# Patient Record
Sex: Female | Born: 1995
Health system: Southern US, Community
[De-identification: ages and names within clinical notes are randomized; demographics above are authoritative.]

## PROBLEM LIST (undated history)

## (undated) ENCOUNTER — Inpatient Hospital Stay (HOSPITAL_COMMUNITY): Payer: Self-pay

## (undated) DIAGNOSIS — Z9189 Other specified personal risk factors, not elsewhere classified: Secondary | ICD-10-CM

## (undated) DIAGNOSIS — F32A Depression, unspecified: Secondary | ICD-10-CM

## (undated) DIAGNOSIS — F329 Major depressive disorder, single episode, unspecified: Secondary | ICD-10-CM

## (undated) DIAGNOSIS — F419 Anxiety disorder, unspecified: Secondary | ICD-10-CM

## (undated) HISTORY — PX: WISDOM TOOTH EXTRACTION: SHX21

## (undated) HISTORY — DX: Major depressive disorder, single episode, unspecified: F32.9

## (undated) HISTORY — DX: Anxiety disorder, unspecified: F41.9

## (undated) HISTORY — DX: Depression, unspecified: F32.A

---
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
## 2016-01-23 DIAGNOSIS — Z3201 Encounter for pregnancy test, result positive: Secondary | ICD-10-CM | POA: Diagnosis not present

## 2016-01-23 DIAGNOSIS — N912 Amenorrhea, unspecified: Secondary | ICD-10-CM | POA: Diagnosis not present

## 2016-04-02 MED FILL — MONO-LINYAH 28 TABLET: 0.25-35 | 84 days supply | Qty: 84 | Fill #3

## 2016-06-11 DIAGNOSIS — H52223 Regular astigmatism, bilateral: Secondary | ICD-10-CM | POA: Diagnosis not present

## 2016-06-11 DIAGNOSIS — H5213 Myopia, bilateral: Secondary | ICD-10-CM | POA: Diagnosis not present

## 2016-06-13 ENCOUNTER — Encounter: Payer: Self-pay | Admitting: Nurse Practitioner

## 2016-06-13 ENCOUNTER — Ambulatory Visit (INDEPENDENT_AMBULATORY_CARE_PROVIDER_SITE_OTHER): Payer: 59 | Admitting: Nurse Practitioner

## 2016-06-13 VITALS — BP 122/82 | Ht 65.5 in | Wt 181.6 lb

## 2016-06-13 DIAGNOSIS — Z0000 Encounter for general adult medical examination without abnormal findings: Secondary | ICD-10-CM | POA: Diagnosis not present

## 2016-06-13 DIAGNOSIS — Z9889 Other specified postprocedural states: Secondary | ICD-10-CM | POA: Insufficient documentation

## 2016-06-13 MED ORDER — NORGESTIMATE-ETH ESTRADIOL 0.25-35 MG-MCG PO TABS: 1.0000 | ORAL_TABLET | Freq: Every day | ORAL | 3 refills | Status: DC

## 2016-06-13 MED FILL — MONO-LINYAH 28 TABLET: 0.25-35 | 84 days supply | Qty: 84 | Fill #0

## 2016-06-13 NOTE — Progress Notes (Signed)
   Subjective:    Patient ID: Kendra Aguilar, female    DOB: [birth year], 21 y.o.   MRN: [id number]  HPI 21 yo female seen today for routine wellness exam.  Wears glasses, gets routine eye exams, last exam 06/2016.  Gets routine dental exams.  LMP 06/12/16, no new sexual partners.  Has previously been screened for gon/chlamydia per pt report. Has rec'd HPV vaccines. Endorses a good appetite, healthy food choices, and exercises frequently.  Would like to discuss birth control options.  Had elective abortion in October 2017 and confirmed she became pregnant due to missed pills.  Was told she needed a Rhogam injection, but did not receive one and questioning if she needs to get it now.     Review of Systems  Constitutional: Negative for activity change, appetite change, chills, fatigue, fever and unexpected weight change.  HENT: Positive for congestion and postnasal drip. Negative for dental problem, ear pain, sinus pain, sinus pressure, sore throat and trouble swallowing.   Respiratory: Negative for cough, chest tightness, shortness of breath and wheezing.   Cardiovascular: Negative for chest pain.  Gastrointestinal: Negative for abdominal distention, abdominal pain, constipation, diarrhea, nausea and vomiting.  Genitourinary: Negative for difficulty urinating, dysuria, enuresis, frequency, genital sores, menstrual problem, pelvic pain, urgency, vaginal discharge and vaginal pain.  Neurological: Negative for weakness and light-headedness.  Psychiatric/Behavioral: Negative for dysphoric mood and sleep disturbance. The patient is not nervous/anxious.        Objective:   Physical Exam  Constitutional: She is oriented to person, place, and time. She appears well-developed and well-nourished. No distress.  HENT:  Right Ear: External ear normal.  Left Ear: External ear normal.  Mouth/Throat: Oropharyngeal exudate present.  Scant, cloudy exudate, no lesions/sores, no injection, tonsils 2+  Neck:  Normal range of motion. Neck supple. No tracheal deviation present. No thyromegaly present.  Cardiovascular: Normal rate, regular rhythm and normal heart sounds.  Exam reveals no gallop.   No murmur heard. Pulmonary/Chest: Effort normal and breath sounds normal.  Abdominal: Soft. She exhibits no distension. There is no tenderness.  Genitourinary:  Genitourinary Comments: Currently on menstrual cycle, no routine pelvic or PAP required at this time   Musculoskeletal: Normal range of motion. She exhibits no edema.  Lymphadenopathy:    She has no cervical adenopathy.  Neurological: She is alert and oriented to person, place, and time.  Skin: Skin is warm and dry. No rash noted.  Psychiatric: She has a normal mood and affect. Her behavior is normal.  Breasts: fine nodularities with some dense breast tissue, no obvious masses palpable, no nipple discharge, symmetrical, no skin discolorations, no axillary adenopathy.      Assessment & Plan:  Routine general medical examination at a health care facility  Discussed several birth control options, prefers to continue on birth control pills at this time.  Will notify office if she decides to try another method.  Educated about not missing pills and using back-up methods if taking antibiotics, missed pills, and help prevent STDs.  Rhogam injection not necessary at this time, due to length of time since abortion.     Return in about 1 year (around 06/13/2017) for physical.

## 2016-09-04 ENCOUNTER — Encounter: Payer: Self-pay | Admitting: Nurse Practitioner

## 2016-09-04 ENCOUNTER — Telehealth: Payer: 59 | Admitting: Physician Assistant

## 2016-09-04 DIAGNOSIS — R198 Other specified symptoms and signs involving the digestive system and abdomen: Secondary | ICD-10-CM

## 2016-09-04 DIAGNOSIS — N941 Unspecified dyspareunia: Secondary | ICD-10-CM

## 2016-09-04 NOTE — Progress Notes (Signed)
Based on what you shared with me it looks like you have a condition that should be evaluated in a face to face office visit.  Giving the pain with bowel movement and on intercourse, you need a pelvic examination in addition to testing to determine definite cause of symptoms. NOTE: Even if you have entered your credit card information for this eVisit, you will not be charged.   If you are having a true medical emergency please call 911.  If you need an urgent face to face visit, [place] has four urgent care centers for your convenience.  If you need care fast and have a high deductible or no insurance consider:   [email or web address]  [phone]  [address], [address] [place], [place] [address] 8 am to 8 pm Monday-Friday 10 am to 4 pm Saturday-Sunday   The following sites will take your  insurance:    . [facility]  [phone] Get Driving Directions Find a Provider at this Location  [address] [place], [place] [address] . 10 am to 8 pm Monday-Friday . 12 pm to 8 pm Saturday-Sunday   . [facility]  [phone] Get Driving Directions Find a Provider at this Location  [address] [place], [place] [address] . 8 am to 8 pm Monday-Friday . 9 am to 6 pm Saturday . 11 am to 6 pm Sunday   . [facility] [place]  [phone] Get Driving Directions  [address] [place], [place] [address] . 8 am to 8 pm Monday-Friday . 8 am to 4 pm Saturday-Sunday   Your e-visit answers were reviewed by a board certified advanced clinical practitioner to complete your personal care plan.  Thank you for using e-Visits.

## 2016-09-09 MED FILL — MONO-LINYAH 28 TABLET: 0.25-35 | 84 days supply | Qty: 84 | Fill #1

## 2016-09-28 ENCOUNTER — Encounter: Payer: Self-pay | Admitting: Nurse Practitioner

## 2016-10-10 ENCOUNTER — Other Ambulatory Visit: Payer: Self-pay | Admitting: Nurse Practitioner

## 2016-10-10 ENCOUNTER — Encounter: Payer: Self-pay | Admitting: Nurse Practitioner

## 2016-10-10 DIAGNOSIS — Z30017 Encounter for initial prescription of implantable subdermal contraceptive: Secondary | ICD-10-CM

## 2016-10-10 NOTE — Progress Notes (Signed)
refe

## 2016-10-21 ENCOUNTER — Encounter: Payer: Self-pay | Admitting: Family Medicine

## 2016-11-10 ENCOUNTER — Ambulatory Visit: Payer: 59 | Admitting: Women's Health

## 2017-04-30 MED FILL — FEMYNOR 0.25-35 MG-MCG TABS: 0.25-35 | 84 days supply | Qty: 84 | Fill #2

## 2017-05-04 ENCOUNTER — Encounter: Payer: Self-pay | Admitting: Nurse Practitioner

## 2017-05-09 ENCOUNTER — Encounter: Payer: Self-pay | Admitting: Nurse Practitioner

## 2017-06-12 DIAGNOSIS — H52223 Regular astigmatism, bilateral: Secondary | ICD-10-CM | POA: Diagnosis not present

## 2017-06-12 DIAGNOSIS — H5213 Myopia, bilateral: Secondary | ICD-10-CM | POA: Diagnosis not present

## 2017-06-22 ENCOUNTER — Telehealth: Payer: Self-pay | Admitting: Nurse Practitioner

## 2017-06-22 ENCOUNTER — Ambulatory Visit (INDEPENDENT_AMBULATORY_CARE_PROVIDER_SITE_OTHER): Payer: 59 | Admitting: Nurse Practitioner

## 2017-06-22 ENCOUNTER — Encounter: Payer: Self-pay | Admitting: Nurse Practitioner

## 2017-06-22 VITALS — BP 118/72 | Ht 65.5 in | Wt 184.8 lb

## 2017-06-22 DIAGNOSIS — Z113 Encounter for screening for infections with a predominantly sexual mode of transmission: Secondary | ICD-10-CM | POA: Diagnosis not present

## 2017-06-22 DIAGNOSIS — Z30013 Encounter for initial prescription of injectable contraceptive: Secondary | ICD-10-CM | POA: Diagnosis not present

## 2017-06-22 DIAGNOSIS — R45851 Suicidal ideations: Secondary | ICD-10-CM

## 2017-06-22 DIAGNOSIS — F322 Major depressive disorder, single episode, severe without psychotic features: Secondary | ICD-10-CM

## 2017-06-22 DIAGNOSIS — Z01419 Encounter for gynecological examination (general) (routine) without abnormal findings: Secondary | ICD-10-CM | POA: Diagnosis not present

## 2017-06-22 DIAGNOSIS — Z0001 Encounter for general adult medical examination with abnormal findings: Secondary | ICD-10-CM

## 2017-06-22 LAB — POCT URINE PREGNANCY: Preg Test, Ur: NEGATIVE

## 2017-06-22 MED ORDER — MEDROXYPROGESTERONE ACETATE 150 MG/ML IM SUSP
150.0000 mg | Freq: Once | INTRAMUSCULAR | Status: AC
  Administered 2017-06-22: 150 mg via INTRAMUSCULAR

## 2017-06-22 NOTE — Patient Instructions (Signed)
Nexplanon Kyleena or Mirena

## 2017-06-22 NOTE — Telephone Encounter (Signed)
Patient signed records release for you to send information to [employer] [facility] as requested.  See in yellow folder on desk.

## 2017-06-23 ENCOUNTER — Encounter: Payer: Self-pay | Admitting: Nurse Practitioner

## 2017-06-23 DIAGNOSIS — R45851 Suicidal ideations: Secondary | ICD-10-CM | POA: Insufficient documentation

## 2017-06-23 LAB — CHLAMYDIA/GONOCOCCUS/TRICHOMONAS, NAA
CHLAMYDIA BY NAA: NEGATIVE
Gonococcus by NAA: NEGATIVE
Trich vag by NAA: NEGATIVE

## 2017-06-23 LAB — HIV ANTIBODY (ROUTINE TESTING W REFLEX): HIV SCREEN 4TH GENERATION: NONREACTIVE

## 2017-06-23 LAB — RPR: RPR Ser Ql: NONREACTIVE

## 2017-06-23 LAB — HEPATITIS C ANTIBODY: Hep C Virus Ab: <0.1 s/co ratio (ref 0.0–0.9)

## 2017-06-23 NOTE — Telephone Encounter (Signed)
Noted  

## 2017-06-23 NOTE — Progress Notes (Signed)
Subjective:    Patient ID: Kendra Aguilar, female    DOB: 1995/12/19, 21 y.o.   MRN: [id number]  HPI presents for her wellness exam.  Has had a new female sexual partner over the past month.  Was diagnosed with trichomonas in January and treated.  The symptoms have resolved.  No pelvic pain.  Regular cycles with normal flow.  Has been off her birth control pills.  States she uses condoms consistently every time she has intercourse.  Last intercourse was 3 days ago.  No pelvic pain or vaginal discharge.  Regular vision and dental exams.  Regular activity.  Is a Consulting civil engineer at [employer].  Uses alcohol 1-2 times per week.  Denies any tobacco drug or marijuana use. Depression screen [facility] 2/9 06/22/2017 06/22/2017 06/13/2016  Decreased Interest 2 2 0  Down, Depressed, Hopeless 2 2 0  PHQ - 2 Score 4 4 0  Altered sleeping 3 3 -  Tired, decreased energy 2 2 -  Change in appetite 1 1 -  Feeling bad or failure about yourself  3 3 -  Trouble concentrating 2 2 -  Moving slowly or fidgety/restless 3 3 -  Suicidal thoughts 2 2 -  PHQ-9 Score 20 20 -   A lengthy discussion regarding her mental health status. She has had trouble since December when she lost her scholarship. Going to counseling once a month to get this back. Has been helping some. Has had brief thoughts of hurting herself but no obsessive thoughts or specific plan. At times, just feels everyone would be better off without having to deal with her. No homicidal thoughts. Denies any current self injurious behaviors.    Review of Systems  Constitutional: Positive for fatigue. Negative for activity change, appetite change and fever.  HENT: Negative for dental problem, ear pain, sinus pressure and sore throat.   Respiratory: Negative for cough, chest tightness, shortness of breath and wheezing.   Cardiovascular: Negative for chest pain.  Gastrointestinal: Negative for abdominal distention, abdominal pain, constipation, diarrhea, nausea and vomiting.    Genitourinary: Negative for difficulty urinating, dysuria, enuresis, frequency, genital sores, menstrual problem, pelvic pain, urgency and vaginal discharge.  Psychiatric/Behavioral: Positive for decreased concentration, dysphoric mood, sleep disturbance and suicidal ideas.       Objective:   Physical Exam  Constitutional: She is oriented to person, place, and time. She appears well-developed. No distress.  HENT:  Right Ear: External ear normal.  Left Ear: External ear normal.  Mouth/Throat: Oropharynx is clear and moist.  Neck: Normal range of motion. Neck supple. No tracheal deviation present. No thyromegaly present.  Cardiovascular: Normal rate, regular rhythm and normal heart sounds. Exam reveals no gallop.  No murmur heard. Pulmonary/Chest: Effort normal and breath sounds normal. Right breast exhibits no inverted nipple, no mass, no skin change and no tenderness. Left breast exhibits no inverted nipple, no mass, no skin change and no tenderness. Breasts are symmetrical.  Dense tissue; axillae no adenopathy.   Abdominal: Soft. She exhibits no distension. There is no tenderness.  Genitourinary:  Genitourinary Comments: Defers GU exam; denies any problems.   Musculoskeletal: She exhibits no edema.  Lymphadenopathy:    She has no cervical adenopathy.  Neurological: She is alert and oriented to person, place, and time.  Skin: Skin is warm and dry. No rash noted.  Psychiatric: She has a normal mood and affect. Her behavior is normal.  Vitals reviewed. urine HCG neg.      Assessment & Plan:  Problem List Items Addressed This Visit      Other   Suicidal thoughts    Other Visit Diagnoses    Well woman exam    -  Primary   Screening examination for STD (sexually transmitted disease)       Relevant Orders   Chlamydia/Gonococcus/Trichomonas, NAA   HIV antibody (Completed)   RPR (Completed)   Hepatitis C Antibody (Completed)   Encounter for initial prescription of injectable  contraceptive       Relevant Medications   medroxyPROGESTERone (DEPO-PROVERA) injection 150 mg (Completed)   Other Relevant Orders   POCT urine pregnancy (Completed)     Meds ordered this encounter  Medications  . medroxyPROGESTERone (DEPO-PROVERA) injection 150 mg   Discussed contraceptive options. Call back if any BTB on Depo Provera. Discussed safe sex issues.  A lengthy discussion regarding her depression. Based on our discussion, do not feel patient is a danger to herself or others at this time. While the situation is not emergent, there is an urgent need for mental health intervention. With patient going back to [employer] today, strongly recommend she seek help at student health to at least direct her to services. She agrees to seek help at crisis center or local ED if she gets worse. She signed a ROR to [employer] so we can get her notes from todays visit to them as quickly as possible.  STD testing pending. Patient will look into Nexplanon or Kyleena as other contraceptive options.

## 2017-06-24 ENCOUNTER — Telehealth: Payer: Self-pay | Admitting: *Deleted

## 2017-06-24 NOTE — Progress Notes (Signed)
Toniann FailWendy, please try again this am. I also sent her a my chart message with her recent labs. Thanks.

## 2017-06-24 NOTE — Telephone Encounter (Signed)
Kendra Aguilar from [place] [facility] called back and stated they cannot send a message back to the nurse without the student ID. I gave them the pt's phone number so they could call and get her student ID.

## 2017-06-24 NOTE — Telephone Encounter (Signed)
Called [employer] [place] [facility] per Eber Jonesarolyn and faxed over [employer]'s last office visit note. Left a message with the nurse to return call to verify that they received the fax and also to discuss getting pt set up with mental health for depression.

## 2017-06-24 NOTE — Telephone Encounter (Signed)
Called the number given and talked with someone at student health. Gave pt info. They will contact pt to get her an appt scheduled for depression. Faxed over notes again to the fax number given.

## 2017-06-25 ENCOUNTER — Encounter: Payer: Self-pay | Admitting: Nurse Practitioner

## 2017-09-01 ENCOUNTER — Encounter: Payer: Self-pay | Admitting: Nurse Practitioner

## 2017-09-02 ENCOUNTER — Other Ambulatory Visit: Payer: Self-pay | Admitting: Nurse Practitioner

## 2017-09-02 ENCOUNTER — Encounter: Payer: Self-pay | Admitting: Nurse Practitioner

## 2017-09-02 MED ORDER — NORGESTIMATE-ETH ESTRADIOL 0.25-35 MG-MCG PO TABS: 1.0000 | ORAL_TABLET | Freq: Every day | ORAL | 3 refills | Status: DC

## 2017-09-02 MED FILL — FEMYNOR 0.25-35 MG-MCG TABS: 0.25-35 | 84 days supply | Qty: 84 | Fill #0

## 2017-09-03 ENCOUNTER — Encounter: Payer: Self-pay | Admitting: Nurse Practitioner

## 2017-09-21 ENCOUNTER — Encounter: Payer: Self-pay | Admitting: Nurse Practitioner

## 2017-09-22 ENCOUNTER — Encounter: Payer: Self-pay | Admitting: Nurse Practitioner

## 2017-09-23 ENCOUNTER — Encounter: Payer: Self-pay | Admitting: Nurse Practitioner

## 2017-09-24 ENCOUNTER — Encounter: Payer: Self-pay | Admitting: Nurse Practitioner

## 2017-09-28 ENCOUNTER — Other Ambulatory Visit: Payer: Self-pay | Admitting: Nurse Practitioner

## 2017-09-28 MED ORDER — PHENTERMINE HCL 37.5 MG PO TABS: 37.5000 mg | ORAL_TABLET | Freq: Every day | ORAL | 0 refills | Status: DC

## 2017-09-29 MED FILL — PHENTERMINE 37.5 MG TABLET: 37.5 | 30 days supply | Qty: 30 | Fill #0

## 2017-09-30 ENCOUNTER — Encounter: Payer: Self-pay | Admitting: Nurse Practitioner

## 2017-12-08 ENCOUNTER — Encounter: Payer: Self-pay | Admitting: Family Medicine

## 2017-12-10 ENCOUNTER — Telehealth: Payer: Self-pay | Admitting: Family Medicine

## 2017-12-10 NOTE — Telephone Encounter (Signed)
Pt requesting copy of shot records.

## 2017-12-10 NOTE — Telephone Encounter (Signed)
Shot record printed and patient is aware.

## 2017-12-18 ENCOUNTER — Other Ambulatory Visit: Payer: Self-pay | Admitting: Family Medicine

## 2017-12-18 ENCOUNTER — Ambulatory Visit (INDEPENDENT_AMBULATORY_CARE_PROVIDER_SITE_OTHER): Payer: 59 | Admitting: Family Medicine

## 2017-12-18 ENCOUNTER — Encounter: Payer: Self-pay | Admitting: Family Medicine

## 2017-12-18 VITALS — BP 118/80 | Ht 65.5 in | Wt 197.0 lb

## 2017-12-18 DIAGNOSIS — Z7689 Persons encountering health services in other specified circumstances: Secondary | ICD-10-CM

## 2017-12-18 DIAGNOSIS — Z3009 Encounter for other general counseling and advice on contraception: Secondary | ICD-10-CM | POA: Diagnosis not present

## 2017-12-18 DIAGNOSIS — Z3202 Encounter for pregnancy test, result negative: Secondary | ICD-10-CM

## 2017-12-18 LAB — POCT URINE PREGNANCY: PREG TEST UR: NEGATIVE

## 2017-12-18 MED ORDER — PHENTERMINE HCL 37.5 MG PO TABS: 37.5000 mg | ORAL_TABLET | Freq: Every day | ORAL | 1 refills | Status: DC

## 2017-12-18 NOTE — Patient Instructions (Signed)
Ketogenic diet information: google diet doctor keto

## 2017-12-18 NOTE — Progress Notes (Signed)
   Subjective:    Patient ID: Kendra Aguilar, female    DOB: 1996/03/14, 22 y.o.   MRN: [id number]  HPI Pt here today to discuss other forms of birth control. Pt states she stopped taking the Norgestimate about one month ago. Pt would like to discuss the birth control patch. Pt would like refill on Phentermine. She would like birth control that is not a daily hassle.   LMP: 12/07/17, been regular since off Depo, menses x 7 days, first 2 days are heaviest. Reports cramping.  Gets migraine on 3rd of menses, with almost every cycle. Reports pain to left temple area, throbbing pain, some nausea but no vomiting, photophobia.  Takes 1 aleve and that helps, usually h/a completely gone in a couple hours. Reports having an aura of seeing black little spots prior to h/a beginning.   Sexually active: 1 partner, long-term relationship. Pt not concerned for STDs. Using condoms for birth control currently, every time.   Doing ketogenic diet and cardio workouts twice per week for weight loss. Would like a refill on phentermine. Denies chest pain or palpitations while taking. States she thinks it was helpful for her. Has lost 19 lbs.  Review of Systems  Constitutional: Negative for unexpected weight change (has been working on weight loss).  Respiratory: Negative for shortness of breath.   Cardiovascular: Negative for chest pain, palpitations and leg swelling.  All other systems reviewed and are negative.      Objective:   Physical Exam  Constitutional: She is oriented to person, place, and time. She appears well-developed and well-nourished. No distress.  HENT:  Head: Normocephalic and atraumatic.  Eyes: Right eye exhibits no discharge. Left eye exhibits no discharge.  Neck: Neck supple.  Cardiovascular: Normal rate, regular rhythm and normal heart sounds.  No murmur heard. Pulmonary/Chest: Effort normal and breath sounds normal. No respiratory distress.  Neurological: She is alert and oriented to  person, place, and time.  Skin: Skin is warm and dry.  Psychiatric: She has a normal mood and affect.  Vitals reviewed.       Assessment & Plan:  1. Encounter for counseling regarding contraception  Pt is looking for contraception to prevent pregnancy.  She is looking for something that she doesn't have to think about on a daily basis.  Given her history of migraine with aura I think the best method for contraception will be an IUD. Discussed this at length with patient and she is in agreement.  Referral to GYN for placement made. Discussed the importance of continuing consistent use of condoms. Pt verbalized understanding.  - Ambulatory referral to Gynecology - POCT urine pregnancy  2. Weight loss management: Phentermine refilled for two months, understands the risks and benefits and that this is only meant for short-term use to help kick start her weight loss. Encouraged continued efforts at diet and exercise in order to maintain weight loss for the long term.  I did discuss with the patient that IUD would be her best approach toward getting good control and avoiding having to take a pill and it would lessen risk of blood clots that could come along with a estrogen patch As attending physician to this patient visit, this patient was seen in conjunction with the nurse practitioner.  The history,physical and treatment plan was reviewed with the nurse practitioner and pertinent findings were verified with the patient.  Also the treatment plan was reviewed with the patient while they were present.

## 2017-12-21 MED ORDER — SERTRALINE HCL 100 MG PO TABS: 100.0000 mg | ORAL_TABLET | Freq: Every day | ORAL | 3 refills | Status: DC

## 2017-12-21 NOTE — Addendum Note (Signed)
Addended by: Margaretha SheffieldBROWN, Breland Trouten S on: 12/21/2017 11:22 AM   Modules accepted: Orders

## 2017-12-21 NOTE — Telephone Encounter (Signed)
Nurses-the patient was seen by Lillia AbedLindsay  the other day The patient's my chart message indicates that she wanted refills of her Zoloft but I do not see where this was given Please connect with the patient if she does want refills she may have this +3 additional refills with follow-up in 4 months on this issue sooner if any problems

## 2017-12-22 MED FILL — PHENTERMINE 37.5 MG TABLET: 37.5 | 30 days supply | Qty: 30 | Fill #0

## 2017-12-31 ENCOUNTER — Telehealth: Payer: Self-pay | Admitting: Family Medicine

## 2017-12-31 MED ORDER — SERTRALINE HCL 100 MG PO TABS: 100.0000 mg | ORAL_TABLET | Freq: Every day | ORAL | 3 refills | Status: DC

## 2017-12-31 NOTE — Telephone Encounter (Signed)
Patient stated her pharmacy is no longer [facility] her listed pharmacy should be [facility] - [place], [place] - [address] [facility]. Medication listed sertraline (ZOLOFT) 100 MG tablet) was sent to [id number] - [place], [place] - [address]

## 2017-12-31 NOTE — Telephone Encounter (Signed)
Prescription sent electronically to pharmacy. Patent notified.

## 2018-01-06 ENCOUNTER — Ambulatory Visit (INDEPENDENT_AMBULATORY_CARE_PROVIDER_SITE_OTHER): Payer: 59 | Admitting: Advanced Practice Midwife

## 2018-01-06 ENCOUNTER — Encounter: Payer: Self-pay | Admitting: Advanced Practice Midwife

## 2018-01-06 VITALS — BP 125/78 | HR 87 | Ht 65.0 in | Wt 195.5 lb

## 2018-01-06 DIAGNOSIS — Z308 Encounter for other contraceptive management: Secondary | ICD-10-CM | POA: Diagnosis not present

## 2018-01-06 DIAGNOSIS — G43909 Migraine, unspecified, not intractable, without status migrainosus: Secondary | ICD-10-CM | POA: Insufficient documentation

## 2018-01-06 MED ORDER — MISOPROSTOL 200 MCG PO TABS: 600.0000 ug | ORAL_TABLET | Freq: Once | ORAL | 0 refills | Status: DC

## 2018-01-06 MED FILL — miSOPROStol 200 MCG TABS: 200 | 1 days supply | Qty: 3 | Fill #0

## 2018-01-06 NOTE — Progress Notes (Signed)
[facility] Visit  Patient name: Kendra Aguilar MRN [id number]  Date of birth: Jun 03, 1995  CC & HPI:  Kendra Aguilar is a 22 y.o.  female presenting today for [facility] discussion .Had been on COCs, but can't remember to take them.  Also, has migraine w/aura, so should avoid estrogen products.  Her PCP recommended paragard, and pt agrees after discussion of Liletta vs Paragard. She consistently uses condoms. Risks/benefits/SE of IUDs discussed.   Pertinent History Reviewed:  Medical & Surgical Hx:   Past Medical History:  Diagnosis Date  . Anxiety   . Depression    History reviewed. No pertinent surgical history. Family History  Problem Relation Age of Onset  . Hyperlipidemia Mother   . Diabetes Brother        Type I  . Other Brother        knee and eye problems    Current Outpatient Medications:  .  phentermine (ADIPEX-P) 37.5 MG tablet, Take 1 tablet (37.5 mg total) by mouth daily before breakfast., Disp: 30 tablet, Rfl: 1 .  sertraline (ZOLOFT) 100 MG tablet, Take 1 tablet (100 mg total) by mouth daily., Disp: 30 tablet, Rfl: 3 .  misoprostol (CYTOTEC) 200 MCG tablet, Take 3 tablets (600 mcg total) by mouth once for 1 dose. 5-6 hours before your IUD appointment, Disp: 3 tablet, Rfl: 0  Current Facility-Administered Medications:  .  hpv vaccine (GARDASIL) injection 0.5 mL, 0.5 mL, Intramuscular, Once, Luking, Jonna Coup, MD Social History: Reviewed -  reports that she has never smoked. She has never used smokeless tobacco.  Review of Systems:   Constitutional: Negative for fever and chills Eyes: Negative for visual disturbances Respiratory: Negative for shortness of breath, dyspnea Cardiovascular: Negative for chest pain or palpitations  Gastrointestinal: Negative for vomiting, diarrhea and constipation; no abdominal pain Genitourinary: Negative for dysuria and urgency, vaginal irritation or itching Musculoskeletal: Negative for back pain, joint pain, myalgias   Neurological: Negative for dizziness and headaches    Objective Findings:    Physical Examination: Vitals:   01/06/18 1511  BP: 125/78  Pulse: 87   General appearance - well appearing, and in no distress Mental status - alert, oriented to person, place, and time Chest:  Normal respiratory effort Heart - normal rate and regular rhythm Pelvic: deferred Musculoskeletal:  Normal range of motion without pain Extremities:  No edema    No results found for this or any previous visit (from the past 24 hour(s)).    Assessment & Plan:  A:   Contraception mgt P:  Wants Paragard; premedicate w/cytotec Plan for placement on her period.  Return for call when her work schedule comes out for paragard; check to see if we need to order vs office stock.  Jacklyn Shell CNM 01/06/2018 4:13 PM

## 2018-01-06 NOTE — Patient Instructions (Signed)

## 2018-01-14 ENCOUNTER — Telehealth: Payer: Self-pay | Admitting: Advanced Practice Midwife

## 2018-01-14 ENCOUNTER — Other Ambulatory Visit: Payer: Self-pay | Admitting: Advanced Practice Midwife

## 2018-01-14 MED ORDER — ULIPRISTAL ACETATE 30 MG PO TABS: 1.0000 | ORAL_TABLET | Freq: Once | ORAL | 0 refills | Status: AC

## 2018-01-14 NOTE — Progress Notes (Signed)
Kendra Aguilar rx for [facility] (Tuesday condom broke)

## 2018-01-25 MED FILL — SERTRALINE HCL 100 MG TAB: 100 | 30 days supply | Qty: 30 | Fill #0

## 2018-02-22 ENCOUNTER — Encounter: Payer: Self-pay | Admitting: Women's Health

## 2018-02-22 ENCOUNTER — Other Ambulatory Visit: Payer: Self-pay

## 2018-02-22 ENCOUNTER — Ambulatory Visit (INDEPENDENT_AMBULATORY_CARE_PROVIDER_SITE_OTHER): Payer: 59 | Admitting: Women's Health

## 2018-02-22 VITALS — BP 138/74 | HR 87 | Ht 66.0 in | Wt 194.0 lb

## 2018-02-22 DIAGNOSIS — F418 Other specified anxiety disorders: Secondary | ICD-10-CM | POA: Diagnosis not present

## 2018-02-22 DIAGNOSIS — Z3201 Encounter for pregnancy test, result positive: Secondary | ICD-10-CM | POA: Diagnosis not present

## 2018-02-22 DIAGNOSIS — R112 Nausea with vomiting, unspecified: Secondary | ICD-10-CM

## 2018-02-22 DIAGNOSIS — Z3490 Encounter for supervision of normal pregnancy, unspecified, unspecified trimester: Secondary | ICD-10-CM

## 2018-02-22 DIAGNOSIS — Z3491 Encounter for supervision of normal pregnancy, unspecified, first trimester: Secondary | ICD-10-CM

## 2018-02-22 LAB — POCT URINE PREGNANCY: Preg Test, Ur: POSITIVE — AB

## 2018-02-22 NOTE — Progress Notes (Signed)
   GYN VISIT Patient name: Kendra Aguilar Clarey MRN [id number]  Date of birth: [birth year] Chief Complaint:   Possible Pregnancy  History of Present Illness:   Kendra Aguilar Kohut is a 22 y.o. [id number] African American female at 1460w3d by LMP of 9/20, being seen today for +HPT. Is certain end of period was 9/25, thinks 1st day was 9/20. Some n/v, declines meds. Taking pnv. On zoloft for dep/anx.      Patient's last menstrual period was 12/25/2017. Review of Systems:   Pertinent items are noted in HPI Denies fever/chills, dizziness, headaches, visual disturbances, fatigue, shortness of breath, chest pain, abdominal pain, vomiting, abnormal vaginal discharge/itching/odor/irritation, problems with periods, bowel movements, urination, or intercourse unless otherwise stated above.  Pertinent History Reviewed:  Reviewed past medical,surgical, social, obstetrical and family history.  Reviewed problem list, medications and allergies. Physical Assessment:   Vitals:   02/22/18 1207  BP: 138/74  Pulse: 87  Weight: 194 lb (88 kg)  Height: 5\' 6"  (1.676 m)  Body mass index is 31.31 kg/m.       Physical Examination:   General appearance: alert, well appearing, and in no distress  Mental status: alert, oriented to person, place, and time  Skin: warm & dry   Cardiovascular: normal heart rate noted  Respiratory: normal respiratory effort, no distress  Abdomen: soft, non-tender   Pelvic: examination not indicated  Extremities: no edema   Results for orders placed or performed in visit on 02/22/18 (from the past 24 hour(s))  POCT urine pregnancy   Collection Time: 02/22/18 12:09 PM  Result Value Ref Range   Preg Test, Ur Positive (A) Negative    Assessment & Plan:  1) 5760w3d by LMP> continue pnv, will get dating u/s  2) Dep/anx> continue zoloft  3) N/V> gave printed prevention/relief measures, let us know if needs meds  Meds: No orders of the defined types were placed in this  encounter.   Orders Placed This Encounter  Procedures  . US OB Comp Less 14 Wks  . POCT urine pregnancy    Return for 1st available dating u/s.  Cheral MarkerKimberly R Laveta Gilkey CNM, [facility]NP-BC 02/22/2018 12:48 PM

## 2018-02-22 NOTE — Patient Instructions (Addendum)
Kendra Aguilar, I greatly value your feedback.  If you receive a survey following your visit with us today, we appreciate you taking the time to fill it out.  Thanks, Kendra Aguilar, CNM, WHNP-BC   Nausea & Vomiting  Have saltine crackers or pretzels by your bed and eat a few bites before you raise your head out of bed in the morning  Eat small frequent meals throughout the day instead of large meals  Drink plenty of fluids throughout the day to stay hydrated, just don't drink a lot of fluids with your meals.  This can make your stomach fill up faster making you feel sick  Do not brush your teeth right after you eat  Products with real ginger are good for nausea, like ginger ale and ginger hard candy Make sure it says made with real ginger!  Sucking on sour candy like lemon heads is also good for nausea  If your prenatal vitamins make you nauseated, take them at night so you will sleep through the nausea  Sea Bands  If you feel like you need medicine for the nausea & vomiting please let us know  If you are unable to keep any fluids or food down please let us know   Constipation  Drink plenty of fluid, preferably water, throughout the day  Eat foods high in fiber such as fruits, vegetables, and grains  Exercise, such as walking, is a good way to keep your bowels regular  Drink warm fluids, especially warm prune juice, or decaf coffee  Eat a 1/2 cup of real oatmeal (not instant), 1/2 cup applesauce, and 1/2-1 cup warm prune juice every day  If needed, you may take Colace (docusate sodium) stool softener once or twice a day to help keep the stool soft. If you are pregnant, wait until you are out of your first trimester (12-14 weeks of pregnancy)  If you still are having problems with constipation, you may take Miralax once daily as needed to help keep your bowels regular.  If you are pregnant, wait until you are out of your first trimester (12-14 weeks of pregnancy)   First  Trimester of Pregnancy The first trimester of pregnancy is from week 1 until the end of week 12 (months 1 through 3). A week after a sperm fertilizes an egg, the egg will implant on the wall of the uterus. This embryo will begin to develop into a baby. Genes from you and your partner are forming the baby. The female genes determine whether the baby is a boy or a girl. At 6-8 weeks, the eyes and face are formed, and the heartbeat can be seen on ultrasound. At the end of 12 weeks, all the baby's organs are formed.  Now that you are pregnant, you will want to do everything you can to have a healthy baby. Two of the most important things are to get good prenatal care and to follow your health care provider's instructions. Prenatal care is all the medical care you receive before the baby's birth. This care will help prevent, find, and treat any problems during the pregnancy and childbirth. BODY CHANGES Your body goes through many changes during pregnancy. The changes vary from woman to woman.   You may gain or lose a couple of pounds at first.  You may feel sick to your stomach (nauseous) and throw up (vomit). If the vomiting is uncontrollable, call your health care provider.  You may tire easily.  You may develop headaches  that can be relieved by medicines approved by your health care provider.  You may urinate more often. Painful urination may mean you have a bladder infection.  You may develop heartburn as a result of your pregnancy.  You may develop constipation because certain hormones are causing the muscles that push waste through your intestines to slow down.  You may develop hemorrhoids or swollen, bulging veins (varicose veins).  Your breasts may begin to grow larger and become tender. Your nipples may stick out more, and the tissue that surrounds them (areola) may become darker.  Your gums may bleed and may be sensitive to brushing and flossing.  Dark spots or blotches (chloasma, mask  of pregnancy) may develop on your face. This will likely fade after the baby is born.  Your menstrual periods will stop.  You may have a loss of appetite.  You may develop cravings for certain kinds of food.  You may have changes in your emotions from day to day, such as being excited to be pregnant or being concerned that something may go wrong with the pregnancy and baby.  You may have more vivid and strange dreams.  You may have changes in your hair. These can include thickening of your hair, rapid growth, and changes in texture. Some women also have hair loss during or after pregnancy, or hair that feels dry or thin. Your hair will most likely return to normal after your baby is born. WHAT TO EXPECT AT YOUR PRENATAL VISITS During a routine prenatal visit:  You will be weighed to make sure you and the baby are growing normally.  Your blood pressure will be taken.  Your abdomen will be measured to track your baby's growth.  The fetal heartbeat will be listened to starting around week 10 or 12 of your pregnancy.  Test results from any previous visits will be discussed. Your health care provider may ask you:  How you are feeling.  If you are feeling the baby move.  If you have had any abnormal symptoms, such as leaking fluid, bleeding, severe headaches, or abdominal cramping.  If you have any questions. Other tests that may be performed during your first trimester include:  Blood tests to find your blood type and to check for the presence of any previous infections. They will also be used to check for low iron levels (anemia) and Rh antibodies. Later in the pregnancy, blood tests for diabetes will be done along with other tests if problems develop.  Urine tests to check for infections, diabetes, or protein in the urine.  An ultrasound to confirm the proper growth and development of the baby.  An amniocentesis to check for possible genetic problems.  Fetal screens for spina  bifida and Down syndrome.  You may need other tests to make sure you and the baby are doing well. HOME CARE INSTRUCTIONS  Medicines  Follow your health care provider's instructions regarding medicine use. Specific medicines may be either safe or unsafe to take during pregnancy.  Take your prenatal vitamins as directed.  If you develop constipation, try taking a stool softener if your health care provider approves. Diet  Eat regular, well-balanced meals. Choose a variety of foods, such as meat or vegetable-based protein, fish, milk and low-fat dairy products, vegetables, fruits, and whole grain breads and cereals. Your health care provider will help you determine the amount of weight gain that is right for you.  Avoid raw meat and uncooked cheese. These carry germs that can  cause birth defects in the baby.  Eating four or five small meals rather than three large meals a day may help relieve nausea and vomiting. If you start to feel nauseous, eating a few soda crackers can be helpful. Drinking liquids between meals instead of during meals also seems to help nausea and vomiting.  If you develop constipation, eat more high-fiber foods, such as fresh vegetables or fruit and whole grains. Drink enough fluids to keep your urine clear or pale yellow. Activity and Exercise  Exercise only as directed by your health care provider. Exercising will help you:  Control your weight.  Stay in shape.  Be prepared for labor and delivery.  Experiencing pain or cramping in the lower abdomen or low back is a good sign that you should stop exercising. Check with your health care provider before continuing normal exercises.  Try to avoid standing for long periods of time. Move your legs often if you must stand in one place for a long time.  Avoid heavy lifting.  Wear low-heeled shoes, and practice good posture.  You may continue to have sex unless your health care provider directs you  otherwise. Relief of Pain or Discomfort  Wear a good support bra for breast tenderness.   Take warm sitz baths to soothe any pain or discomfort caused by hemorrhoids. Use hemorrhoid cream if your health care provider approves.   Rest with your legs elevated if you have leg cramps or low back pain.  If you develop varicose veins in your legs, wear support hose. Elevate your feet for 15 minutes, 3-4 times a day. Limit salt in your diet. Prenatal Care  Schedule your prenatal visits by the twelfth week of pregnancy. They are usually scheduled monthly at first, then more often in the last 2 months before delivery.  Write down your questions. Take them to your prenatal visits.  Keep all your prenatal visits as directed by your health care provider. Safety  Wear your seat belt at all times when driving.  Make a list of emergency phone numbers, including numbers for family, friends, the hospital, and police and fire departments. General Tips  Ask your health care provider for a referral to a local prenatal education class. Begin classes no later than at the beginning of month 6 of your pregnancy.  Ask for help if you have counseling or nutritional needs during pregnancy. Your health care provider can offer advice or refer you to specialists for help with various needs.  Do not use hot tubs, steam rooms, or saunas.  Do not douche or use tampons or scented sanitary pads.  Do not cross your legs for long periods of time.  Avoid cat litter boxes and soil used by cats. These carry germs that can cause birth defects in the baby and possibly loss of the fetus by miscarriage or stillbirth.  Avoid all smoking, herbs, alcohol, and medicines not prescribed by your health care provider. Chemicals in these affect the formation and growth of the baby.  Schedule a dentist appointment. At home, brush your teeth with a soft toothbrush and be gentle when you floss. SEEK MEDICAL CARE IF:   You have  dizziness.  You have mild pelvic cramps, pelvic pressure, or nagging pain in the abdominal area.  You have persistent nausea, vomiting, or diarrhea.  You have a bad smelling vaginal discharge.  You have pain with urination.  You notice increased swelling in your face, hands, legs, or ankles. SEEK IMMEDIATE MEDICAL CARE IF:  You have a fever.  You are leaking fluid from your vagina.  You have spotting or bleeding from your vagina.  You have severe abdominal cramping or pain.  You have rapid weight gain or loss.  You vomit blood or material that looks like coffee grounds.  You are exposed to [place] measles and have never had them.  You are exposed to fifth disease or chickenpox.  You develop a severe headache.  You have shortness of breath.  You have any kind of trauma, such as from a fall or a car accident. Document Released: 03/18/2001 Document Revised: 08/08/2013 Document Reviewed: 02/01/2013 [facility] Patient Information 2015 [employer] [place]. This information is not intended to replace advice given to you by your health care provider. Make sure you discuss any questions you have with your health care provider.

## 2018-02-25 ENCOUNTER — Ambulatory Visit (INDEPENDENT_AMBULATORY_CARE_PROVIDER_SITE_OTHER): Payer: 59

## 2018-02-25 ENCOUNTER — Ambulatory Visit: Payer: 59 | Admitting: Advanced Practice Midwife

## 2018-02-25 DIAGNOSIS — Z3491 Encounter for supervision of normal pregnancy, unspecified, first trimester: Secondary | ICD-10-CM

## 2018-02-25 DIAGNOSIS — O3680X0 Pregnancy with inconclusive fetal viability, not applicable or unspecified: Secondary | ICD-10-CM

## 2018-02-25 NOTE — Progress Notes (Signed)
US 8 wks single IUP w/ ys,positive fht 166 bpm,normal ovaries bilat,crl 16.25 mm

## 2018-02-26 ENCOUNTER — Telehealth: Payer: Self-pay | Admitting: Advanced Practice Midwife

## 2018-02-26 NOTE — Telephone Encounter (Signed)
Pt called stating that she has some sharp pains left abdomen. She states that it started this morning and then she vomited. She states it felt different from her normal nausea. She denies vaginal bleeding and contractions. Advised to stay hydrated and rest as much as possible. Advised to call back if s/s worsen or fail to improve or if she experiences vaginal bleeding, severe cramping. Pt verbalized understanding.

## 2018-02-26 NOTE — Telephone Encounter (Signed)
Patient called, stated that she is having sharp pains in her upper left abdomin.  She is 8 weeks and 6 days pregnant.  She was seen yesterday.  [phone]

## 2018-03-10 ENCOUNTER — Encounter (HOSPITAL_COMMUNITY): Payer: Self-pay | Admitting: Emergency Medicine

## 2018-03-10 ENCOUNTER — Other Ambulatory Visit: Payer: Self-pay

## 2018-03-10 ENCOUNTER — Emergency Department (HOSPITAL_COMMUNITY)
Admission: EM | Admit: 2018-03-10 | Discharge: 2018-03-11 | Disposition: A | Discharge status: Home / Self Care | Payer: 59 | Attending: Emergency Medicine | Admitting: Emergency Medicine

## 2018-03-10 DIAGNOSIS — Z3A09 9 weeks gestation of pregnancy: Secondary | ICD-10-CM | POA: Insufficient documentation

## 2018-03-10 DIAGNOSIS — Z2913 Encounter for prophylactic Rho(D) immune globulin: Secondary | ICD-10-CM | POA: Insufficient documentation

## 2018-03-10 DIAGNOSIS — O9989 Other specified diseases and conditions complicating pregnancy, childbirth and the puerperium: Secondary | ICD-10-CM | POA: Diagnosis present

## 2018-03-10 DIAGNOSIS — O200 Threatened abortion: Secondary | ICD-10-CM | POA: Insufficient documentation

## 2018-03-10 LAB — CBC WITH DIFFERENTIAL/PLATELET
Abs Immature Granulocytes: 0.06 10*3/uL (ref 0.00–0.07)
Basophils Absolute: 0.1 10*3/uL (ref 0.0–0.1)
Basophils Relative: 0 %
EOS PCT: 1 %
Eosinophils Absolute: 0.1 10*3/uL (ref 0.0–0.5)
HCT: 37.6 % (ref 36.0–46.0)
Hemoglobin: 11.9 g/dL — ABNORMAL LOW (ref 12.0–15.0)
Immature Granulocytes: 0 %
Lymphocytes Relative: 17 %
Lymphs Abs: 2.4 10*3/uL (ref 0.7–4.0)
MCH: 26.8 pg (ref 26.0–34.0)
MCHC: 31.6 g/dL (ref 30.0–36.0)
MCV: 84.7 fL (ref 80.0–100.0)
Monocytes Absolute: 0.7 10*3/uL (ref 0.1–1.0)
Monocytes Relative: 5 %
Neutro Abs: 11 10*3/uL — ABNORMAL HIGH (ref 1.7–7.7)
Neutrophils Relative %: 77 %
PLATELETS: 395 10*3/uL (ref 150–400)
RBC: 4.44 MIL/uL (ref 3.87–5.11)
RDW: 13.2 % (ref 11.5–15.5)
WBC: 14.3 10*3/uL — AB (ref 4.0–10.5)
nRBC: 0 % (ref 0.0–0.2)

## 2018-03-10 LAB — URINALYSIS, ROUTINE W REFLEX MICROSCOPIC
Bacteria, UA: NONE SEEN
Bilirubin Urine: NEGATIVE
Glucose, UA: NEGATIVE mg/dL
Ketones, ur: NEGATIVE mg/dL
Leukocytes, UA: NEGATIVE
Nitrite: NEGATIVE
PROTEIN: NEGATIVE mg/dL
Specific Gravity, Urine: 1.019 (ref 1.005–1.030)
pH: 6 (ref 5.0–8.0)

## 2018-03-10 LAB — WET PREP, GENITAL
Clue Cells Wet Prep HPF POC: NONE SEEN
Sperm: NONE SEEN
Trich, Wet Prep: NONE SEEN
Yeast Wet Prep HPF POC: NONE SEEN

## 2018-03-10 LAB — ABO/RH: ABO/RH(D): A NEG

## 2018-03-10 MED ORDER — RHO D IMMUNE GLOBULIN 1500 UNIT/2ML IJ SOSY
300.0000 ug | PREFILLED_SYRINGE | Freq: Once | INTRAMUSCULAR | Status: AC
  Administered 2018-03-11: 300 ug via INTRAMUSCULAR

## 2018-03-10 NOTE — ED Triage Notes (Signed)
Patient states she is [redacted] weeks pregnant. Began cramping and bleeding today.

## 2018-03-11 DIAGNOSIS — Z2913 Encounter for prophylactic Rho(D) immune globulin: Secondary | ICD-10-CM | POA: Diagnosis not present

## 2018-03-11 DIAGNOSIS — O200 Threatened abortion: Secondary | ICD-10-CM | POA: Diagnosis not present

## 2018-03-11 DIAGNOSIS — Z3A09 9 weeks gestation of pregnancy: Secondary | ICD-10-CM | POA: Diagnosis not present

## 2018-03-11 LAB — BASIC METABOLIC PANEL
Anion gap: 10 (ref 5–15)
BUN: 9 mg/dL (ref 6–20)
CO2: 21 mmol/L — ABNORMAL LOW (ref 22–32)
Calcium: 9 mg/dL (ref 8.9–10.3)
Chloride: 104 mmol/L (ref 98–111)
Creatinine, Ser: 0.61 mg/dL (ref 0.44–1.00)
GFR calc Af Amer: >60 mL/min (ref >60)
GFR calc non Af Amer: >60 mL/min (ref >60)
GLUCOSE: 86 mg/dL (ref 70–99)
Potassium: 3.5 mmol/L (ref 3.5–5.1)
Sodium: 135 mmol/L (ref 135–145)

## 2018-03-11 LAB — HCG, QUANTITATIVE, PREGNANCY: hCG, Beta Chain, Quant, S: 110846 m[IU]/mL — ABNORMAL HIGH (ref <5)

## 2018-03-11 NOTE — ED Notes (Signed)
Delay on rho injection had to wait for CBC and type and screen. Lab states it will be 15 to 20 more minutes before injection is ready. Patient notified and hourly rounding completed hourly.

## 2018-03-11 NOTE — ED Provider Notes (Signed)
Pt stable, no complaints, awaiting rhogam Appropriate for d/c home    Zadie RhineWickline, Kerin Cecchi, MD 03/11/18 [phone]

## 2018-03-12 ENCOUNTER — Encounter (HOSPITAL_COMMUNITY): Payer: Self-pay | Admitting: *Deleted

## 2018-03-12 ENCOUNTER — Other Ambulatory Visit: Payer: Self-pay | Admitting: Obstetrics and Gynecology

## 2018-03-12 ENCOUNTER — Inpatient Hospital Stay (HOSPITAL_COMMUNITY)
Admission: AD | Admit: 2018-03-12 | Discharge: 2018-03-12 | Disposition: A | Discharge status: Home / Self Care | Payer: 59 | Attending: Obstetrics and Gynecology | Admitting: Obstetrics and Gynecology

## 2018-03-12 ENCOUNTER — Other Ambulatory Visit: Payer: Self-pay

## 2018-03-12 DIAGNOSIS — N939 Abnormal uterine and vaginal bleeding, unspecified: Secondary | ICD-10-CM | POA: Diagnosis not present

## 2018-03-12 DIAGNOSIS — O26891 Other specified pregnancy related conditions, first trimester: Secondary | ICD-10-CM | POA: Diagnosis not present

## 2018-03-12 DIAGNOSIS — O200 Threatened abortion: Secondary | ICD-10-CM | POA: Diagnosis not present

## 2018-03-12 DIAGNOSIS — O26899 Other specified pregnancy related conditions, unspecified trimester: Secondary | ICD-10-CM

## 2018-03-12 DIAGNOSIS — Z3A10 10 weeks gestation of pregnancy: Secondary | ICD-10-CM | POA: Diagnosis not present

## 2018-03-12 DIAGNOSIS — R109 Unspecified abdominal pain: Secondary | ICD-10-CM

## 2018-03-12 DIAGNOSIS — O208 Other hemorrhage in early pregnancy: Principal | ICD-10-CM

## 2018-03-12 DIAGNOSIS — O209 Hemorrhage in early pregnancy, unspecified: Secondary | ICD-10-CM

## 2018-03-12 LAB — URINALYSIS, ROUTINE W REFLEX MICROSCOPIC
Bilirubin Urine: NEGATIVE
Glucose, UA: NEGATIVE mg/dL
Hgb urine dipstick: NEGATIVE
Ketones, ur: 80 mg/dL — AB
Nitrite: NEGATIVE
PROTEIN: NEGATIVE mg/dL
Specific Gravity, Urine: 1.024 (ref 1.005–1.030)
pH: 5 (ref 5.0–8.0)

## 2018-03-12 LAB — RH IG WORKUP (INCLUDES ABO/RH)
ABO/RH(D): A NEG
Antibody Screen: NEGATIVE
Gestational Age(Wks): 9
Unit division: 0

## 2018-03-12 LAB — RPR: RPR Ser Ql: NONREACTIVE

## 2018-03-12 LAB — GC/CHLAMYDIA PROBE AMP (~~LOC~~) NOT AT ARMC
Chlamydia: NEGATIVE
Neisseria Gonorrhea: NEGATIVE

## 2018-03-12 LAB — HIV ANTIBODY (ROUTINE TESTING W REFLEX): HIV Screen 4th Generation wRfx: NONREACTIVE

## 2018-03-12 NOTE — MAU Provider Note (Signed)
History     CSN: [id number]  Arrival date and time: 03/12/18 1728   First Provider Initiated Contact with Patient 03/12/18 1815      Chief Complaint  Patient presents with  . Abdominal Pain  . Spotting   Kendra Aguilar is a 22 y.o. G2P0 at [redacted]w[redacted]d by LMP who presents to MAU with complaints of abdominal cramping and spotting. She reports abdominal cramping and vaginal bleeding started on Wednesday morning, was seen at [facility] ED for symptoms. Reports being diagnosed with threatened miscarriage but denies having a ultrasound "to check on baby". She describes abdominal pain as lower abdominal cramping, denies worsening of pain since being seen at [facility], rates pain 4/10- has not taken any medication for abdominal pain. She reports vaginal bleeding has decreased to a minimal since being seen at [facility] and it is now dark red spotting when she wipes. Presents to MAU today to "check on baby".    OB History    Gravida  2   Para      Term      Preterm      AB  1   Living        SAB      TAB  1   Ectopic      Multiple      Live Births              Past Medical History:  Diagnosis Date  . Anxiety   . Depression     Past Surgical History:  Procedure Laterality Date  . NO PAST SURGERIES      Family History  Problem Relation Age of Onset  . Hyperlipidemia Mother   . Diabetes Brother        Type I  . Other Brother        knee and eye problems    Social History   Tobacco Use  . Smoking status: Never Smoker  . Smokeless tobacco: Never Used  Substance Use Topics  . Alcohol use: No  . Drug use: No    Allergies:  Allergies  Allergen Reactions  . Latex     rash    Facility-Administered Medications Prior to Admission  Medication Dose Route Frequency Provider Last Rate Last Dose  . hpv vaccine (GARDASIL) injection 0.5 mL  0.5 mL Intramuscular Once Babs Sciara, MD       Medications Prior to Admission  Medication Sig Dispense Refill  Last Dose  . misoprostol (CYTOTEC) 200 MCG tablet Take 3 tablets (600 mcg total) by mouth once for 1 dose. 5-6 hours before your IUD appointment 3 tablet 0   . Prenatal Vit-Fe Fumarate-FA (MULTIVITAMIN-PRENATAL) 27-0.8 MG TABS tablet Take 1 tablet by mouth daily at 12 noon.   Taking  . sertraline (ZOLOFT) 100 MG tablet Take 1 tablet (100 mg total) by mouth daily. 30 tablet 3 Taking    Review of Systems  Constitutional: Negative.   Respiratory: Negative.   Cardiovascular: Negative.   Gastrointestinal: Positive for abdominal pain. Negative for constipation, diarrhea, nausea and vomiting.  Genitourinary: Positive for vaginal bleeding. Negative for difficulty urinating, dysuria, pelvic pain, urgency and vaginal pain.   Physical Exam   Blood pressure 126/68, pulse 83, temperature 97.7 F (36.5 C), temperature source Oral, resp. rate 19, height 5\' 5"  (1.651 m), weight 86.1 kg, last menstrual period 12/25/2017, SpO2 99 %.  Physical Exam  Nursing note and vitals reviewed. Constitutional: She is oriented to person, place, and time. She  appears well-developed and well-nourished. No distress.  Cardiovascular: Normal rate, regular rhythm and normal heart sounds.  Respiratory: Effort normal and breath sounds normal. No respiratory distress. She has no wheezes.  GI: Soft. Bowel sounds are normal. She exhibits no distension. There is no tenderness. There is no rebound.  Genitourinary:  Genitourinary Comments: Scant amount of dark red vaginal bleeding present, cervix closed.   Neurological: She is alert and oriented to person, place, and time.   MAU Course  Procedures  MDM Urine culture pending  Bedside US  Pt informed that the ultrasound is considered a limited OB ultrasound and is not intended to be a complete ultrasound exam.  Patient also informed that the ultrasound is not being completed with the intent of assessing for fetal or placental anomalies or any pelvic abnormalities.  Explained that  the purpose of today's ultrasound is to assess for  viability.  Patient acknowledges the purpose of the exam and the limitations of the study.    FHR 158 by bedside US   Reviewed labs [facility] and [facility]:  Vaginal swabs deferred d/t recent [facility]. Rhogam given to patient on 12/4.   Educated and discussed threatened miscarriage precautions, discussed reasons to return to MAU. Educated on safe medications to take during pregnancy for abdominal cramping. Pt stable at time of discharge.  Assessment and Plan   1. Abdominal cramping affecting pregnancy, antepartum   2. Vaginal spotting   3. [redacted] weeks gestation of pregnancy   4. Threatened miscarriage in early pregnancy    Discharge home Follow up on 12/11 for initial prenatal visit  Return to MAU as needed Threatened miscarriage precautions and Pelvic rest  Urine culture pending  Safe medications during pregnancy- list given   Follow-up Information    [facility]. Go on 03/17/2018.   Specialty:  Obstetrics and Gynecology Why:  Follow up on 12/11 for initial prenatal appointment and return to MAU as needed  Contact information: [address] [place] [id number] [phone]          Sharyon CableVeronica C Darby Shadwick CNM 03/12/2018, 6:36 PM

## 2018-03-12 NOTE — MAU Note (Signed)
Presents with c/o abdominal cramping and spotting.  Reports was [facility] Wednesday for bright red VB, states was informed maybe threatened miscarriage.

## 2018-03-12 NOTE — ED Provider Notes (Signed)
[facility] EMERGENCY DEPARTMENT Provider Note   CSN: [id number] Arrival date & time: 03/10/18  2109     History   Chief Complaint Chief Complaint  Patient presents with  . Abdominal Pain    Patient [redacted] weeks pregnant, cramping and bleeding     HPI Kendra Aguilar is a 22 y.o. female.  The history is provided by the patient and a parent.  Abdominal Pain   This is a new problem. The current episode started 6 to 12 hours ago. The problem occurs constantly. The problem has not changed since onset.The pain is located in the generalized abdominal region. The pain is mild. Associated symptoms include nausea.    Past Medical History:  Diagnosis Date  . Anxiety   . Depression     Patient Active Problem List   Diagnosis Date Noted  . Depression with anxiety 02/22/2018  . Pregnant 02/22/2018  . Migraine 01/06/2018  . Suicidal thoughts 06/23/2017  . History of elective abortion 06/13/2016  . ANKLE SPRAIN, RIGHT 05/01/2010  . CHICKENPOX, HX OF 08/08/2008    History reviewed. No pertinent surgical history.   OB History    Gravida  2   Para      Term      Preterm      AB  1   Living        SAB      TAB  1   Ectopic      Multiple      Live Births               Home Medications    Prior to Admission medications   Medication Sig Start Date End Date Taking? Authorizing Provider  misoprostol (CYTOTEC) 200 MCG tablet Take 3 tablets (600 mcg total) by mouth once for 1 dose. 5-6 hours before your IUD appointment 01/06/18 01/06/18  Cresenzo-Dishmon, Scarlette Calico, CNM  Prenatal Vit-Fe Fumarate-FA (MULTIVITAMIN-PRENATAL) 27-0.8 MG TABS tablet Take 1 tablet by mouth daily at 12 noon.    [provider]  sertraline (ZOLOFT) 100 MG tablet Take 1 tablet (100 mg total) by mouth daily. 12/31/17   Babs Sciara, MD    Family History Family History  Problem Relation Age of Onset  . Hyperlipidemia Mother   . Diabetes Brother        Type I  . Other Brother          knee and eye problems    Social History Social History   Tobacco Use  . Smoking status: Never Smoker  . Smokeless tobacco: Never Used  Substance Use Topics  . Alcohol use: No  . Drug use: No     Allergies   Latex   Review of Systems Review of Systems  Gastrointestinal: Positive for abdominal pain and nausea.  Genitourinary: Positive for pelvic pain and vaginal bleeding. Negative for vaginal discharge.  All other systems reviewed and are negative.    Physical Exam Updated Vital Signs BP 111/76   Pulse 66   Temp 98.3 F (36.8 C) (Oral)   Resp 20   Ht 5\' 5"  (1.651 m)   Wt 86.2 kg   LMP 12/25/2017 (Exact Date)   SpO2 100%   BMI 31.62 kg/m   Physical Exam  Constitutional: She appears well-developed and well-nourished.  HENT:  Head: Normocephalic and atraumatic.  Neck: Normal range of motion.  Cardiovascular: Normal rate and regular rhythm.  Pulmonary/Chest: No stridor. No respiratory distress.  Abdominal: She exhibits no distension.  Genitourinary: Right  adnexum displays no mass. Left adnexum displays no mass. There is tenderness and bleeding in the vagina. No erythema in the vagina. No signs of injury around the vagina.  Neurological: She is alert.  Nursing note and vitals reviewed.    ED Treatments / Results  Labs (all labs ordered are listed, but only abnormal results are displayed) Labs Reviewed  WET PREP, GENITAL - Abnormal; Notable for the following components:      Result Value   WBC, Wet Prep HPF POC FEW (*)    All other components within normal limits  HCG, QUANTITATIVE, PREGNANCY - Abnormal; Notable for the following components:   hCG, Beta Chain, Quant, S 110,846 (*)    All other components within normal limits  CBC WITH DIFFERENTIAL/PLATELET - Abnormal; Notable for the following components:   WBC 14.3 (*)    Hemoglobin 11.9 (*)    Neutro Abs 11.0 (*)    All other components within normal limits  BASIC METABOLIC PANEL - Abnormal;  Notable for the following components:   CO2 21 (*)    All other components within normal limits  URINALYSIS, ROUTINE W REFLEX MICROSCOPIC - Abnormal; Notable for the following components:   Hgb urine dipstick MODERATE (*)    All other components within normal limits  RPR  HIV ANTIBODY (ROUTINE TESTING W REFLEX)  ABO/RH  RH IG WORKUP (INCLUDES ABO/RH)  GC/CHLAMYDIA PROBE AMP ([place]) NOT [facility]    EKG None  Radiology No results found.  Procedures Procedures (including critical care time)  Medications Ordered in ED Medications  rho (d) immune globulin (RHIG/RHOPHYLAC) injection 300 mcg (300 mcg Intramuscular Given 03/11/18 0123)     Initial Impression / Assessment and Plan / ED Course  I have reviewed the triage vital signs and the nursing notes.  Pertinent labs & imaging results that were available during my care of the patient were reviewed by me and considered in my medical decision making (see chart for details).     Previously documented IUP. Rh negative, rhogam ordered. Transfer of care pending bmp, planned discharge with close Ob follow up.   Final Clinical Impressions(s) / ED Diagnoses   Final diagnoses:  Threatened miscarriage in early pregnancy    ED Discharge Orders    None       Saidah Kempton, Barbara CowerJason, MD 03/12/18 [phone]

## 2018-03-12 NOTE — Progress Notes (Signed)
V. Aundria Rudogers, CNM @ bedside performing bedside U/S.  FHR noted

## 2018-03-15 ENCOUNTER — Ambulatory Visit (INDEPENDENT_AMBULATORY_CARE_PROVIDER_SITE_OTHER): Payer: 59

## 2018-03-15 ENCOUNTER — Other Ambulatory Visit: Payer: Self-pay | Admitting: Obstetrics and Gynecology

## 2018-03-15 DIAGNOSIS — O208 Other hemorrhage in early pregnancy: Principal | ICD-10-CM

## 2018-03-15 DIAGNOSIS — O209 Hemorrhage in early pregnancy, unspecified: Secondary | ICD-10-CM

## 2018-03-15 NOTE — Progress Notes (Signed)
US 10+4 wks,measurements c/w dates,fhr 166 bpm,right corpus luteal cyst 3 x 1.7 x 1.9 cm,left ovary not visualized,left adnexa wnl,crl 39.71 mm,anterior placenta gr 0

## 2018-03-17 ENCOUNTER — Ambulatory Visit (INDEPENDENT_AMBULATORY_CARE_PROVIDER_SITE_OTHER): Payer: 59 | Admitting: Advanced Practice Midwife

## 2018-03-17 ENCOUNTER — Other Ambulatory Visit (HOSPITAL_COMMUNITY)
Admission: RE | Admit: 2018-03-17 | Discharge: 2018-03-17 | Disposition: A | Discharge status: Home / Self Care | Payer: 59 | Source: Ambulatory Visit | Attending: Advanced Practice Midwife | Admitting: Advanced Practice Midwife

## 2018-03-17 ENCOUNTER — Ambulatory Visit: Payer: 59 | Admitting: *Deleted

## 2018-03-17 ENCOUNTER — Encounter: Payer: Self-pay | Admitting: Advanced Practice Midwife

## 2018-03-17 ENCOUNTER — Other Ambulatory Visit: Payer: Self-pay

## 2018-03-17 VITALS — BP 115/68 | HR 82 | Wt 191.0 lb

## 2018-03-17 DIAGNOSIS — Z3481 Encounter for supervision of other normal pregnancy, first trimester: Secondary | ICD-10-CM

## 2018-03-17 DIAGNOSIS — Z124 Encounter for screening for malignant neoplasm of cervix: Secondary | ICD-10-CM

## 2018-03-17 DIAGNOSIS — Z3A10 10 weeks gestation of pregnancy: Secondary | ICD-10-CM

## 2018-03-17 DIAGNOSIS — Z3490 Encounter for supervision of normal pregnancy, unspecified, unspecified trimester: Secondary | ICD-10-CM | POA: Insufficient documentation

## 2018-03-17 DIAGNOSIS — Z1389 Encounter for screening for other disorder: Secondary | ICD-10-CM

## 2018-03-17 DIAGNOSIS — Z331 Pregnant state, incidental: Secondary | ICD-10-CM

## 2018-03-17 DIAGNOSIS — Z3682 Encounter for antenatal screening for nuchal translucency: Secondary | ICD-10-CM

## 2018-03-17 LAB — POCT URINALYSIS DIPSTICK OB
Blood, UA: NEGATIVE
Glucose, UA: NEGATIVE
Ketones, UA: NEGATIVE
LEUKOCYTES UA: NEGATIVE
Nitrite, UA: NEGATIVE
POC,PROTEIN,UA: NEGATIVE

## 2018-03-17 LAB — CULTURE, OB URINE

## 2018-03-17 NOTE — Patient Instructions (Signed)
 First Trimester of Pregnancy The first trimester of pregnancy is from week 1 until the end of week 12 (months 1 through 3). A week after a sperm fertilizes an egg, the egg will implant on the wall of the uterus. This embryo will begin to develop into a baby. Genes from you and your partner are forming the baby. The female genes determine whether the baby is a boy or a girl. At 6-8 weeks, the eyes and face are formed, and the heartbeat can be seen on ultrasound. At the end of 12 weeks, all the baby's organs are formed.  Now that you are pregnant, you will want to do everything you can to have a healthy baby. Two of the most important things are to get good prenatal care and to follow your health care provider's instructions. Prenatal care is all the medical care you receive before the baby's birth. This care will help prevent, find, and treat any problems during the pregnancy and childbirth. BODY CHANGES Your body goes through many changes during pregnancy. The changes vary from woman to woman.   You may gain or lose a couple of pounds at first.  You may feel sick to your stomach (nauseous) and throw up (vomit). If the vomiting is uncontrollable, call your health care provider.  You may tire easily.  You may develop headaches that can be relieved by medicines approved by your health care provider.  You may urinate more often. Painful urination may mean you have a bladder infection.  You may develop heartburn as a result of your pregnancy.  You may develop constipation because certain hormones are causing the muscles that push waste through your intestines to slow down.  You may develop hemorrhoids or swollen, bulging veins (varicose veins).  Your breasts may begin to grow larger and become tender. Your nipples may stick out more, and the tissue that surrounds them (areola) may become darker.  Your gums may bleed and may be sensitive to brushing and flossing.  Dark spots or blotches  (chloasma, mask of pregnancy) may develop on your face. This will likely fade after the baby is born.  Your menstrual periods will stop.  You may have a loss of appetite.  You may develop cravings for certain kinds of food.  You may have changes in your emotions from day to day, such as being excited to be pregnant or being concerned that something may go wrong with the pregnancy and baby.  You may have more vivid and strange dreams.  You may have changes in your hair. These can include thickening of your hair, rapid growth, and changes in texture. Some women also have hair loss during or after pregnancy, or hair that feels dry or thin. Your hair will most likely return to normal after your baby is born. WHAT TO EXPECT AT YOUR PRENATAL VISITS During a routine prenatal visit:  You will be weighed to make sure you and the baby are growing normally.  Your blood pressure will be taken.  Your abdomen will be measured to track your baby's growth.  The fetal heartbeat will be listened to starting around week 10 or 12 of your pregnancy.  Test results from any previous visits will be discussed. Your health care provider may ask you:  How you are feeling.  If you are feeling the baby move.  If you have had any abnormal symptoms, such as leaking fluid, bleeding, severe headaches, or abdominal cramping.  If you have any questions. Other   tests that may be performed during your first trimester include:  Blood tests to find your blood type and to check for the presence of any previous infections. They will also be used to check for low iron levels (anemia) and Rh antibodies. Later in the pregnancy, blood tests for diabetes will be done along with other tests if problems develop.  Urine tests to check for infections, diabetes, or protein in the urine.  An ultrasound to confirm the proper growth and development of the baby.  An amniocentesis to check for possible genetic problems.  Fetal  screens for spina bifida and Down syndrome.  You may need other tests to make sure you and the baby are doing well. HOME CARE INSTRUCTIONS  Medicines  Follow your health care provider's instructions regarding medicine use. Specific medicines may be either safe or unsafe to take during pregnancy.  Take your prenatal vitamins as directed.  If you develop constipation, try taking a stool softener if your health care provider approves. Diet  Eat regular, well-balanced meals. Choose a variety of foods, such as meat or vegetable-based protein, fish, milk and low-fat dairy products, vegetables, fruits, and whole grain breads and cereals. Your health care provider will help you determine the amount of weight gain that is right for you.  Avoid raw meat and uncooked cheese. These carry germs that can cause birth defects in the baby.  Eating four or five small meals rather than three large meals a day may help relieve nausea and vomiting. If you start to feel nauseous, eating a few soda crackers can be helpful. Drinking liquids between meals instead of during meals also seems to help nausea and vomiting.  If you develop constipation, eat more high-fiber foods, such as fresh vegetables or fruit and whole grains. Drink enough fluids to keep your urine clear or pale yellow. Activity and Exercise  Exercise only as directed by your health care provider. Exercising will help you:  Control your weight.  Stay in shape.  Be prepared for labor and delivery.  Experiencing pain or cramping in the lower abdomen or low back is a good sign that you should stop exercising. Check with your health care provider before continuing normal exercises.  Try to avoid standing for long periods of time. Move your legs often if you must stand in one place for a long time.  Avoid heavy lifting.  Wear low-heeled shoes, and practice good posture.  You may continue to have sex unless your health care provider directs you  otherwise. Relief of Pain or Discomfort  Wear a good support bra for breast tenderness.   Take warm sitz baths to soothe any pain or discomfort caused by hemorrhoids. Use hemorrhoid cream if your health care provider approves.   Rest with your legs elevated if you have leg cramps or low back pain.  If you develop varicose veins in your legs, wear support hose. Elevate your feet for 15 minutes, 3-4 times a day. Limit salt in your diet. Prenatal Care  Schedule your prenatal visits by the twelfth week of pregnancy. They are usually scheduled monthly at first, then more often in the last 2 months before delivery.  Write down your questions. Take them to your prenatal visits.  Keep all your prenatal visits as directed by your health care provider. Safety  Wear your seat belt at all times when driving.  Make a list of emergency phone numbers, including numbers for family, friends, the hospital, and police and fire departments. General   Tips  Ask your health care provider for a referral to a local prenatal education class. Begin classes no later than at the beginning of month 6 of your pregnancy.  Ask for help if you have counseling or nutritional needs during pregnancy. Your health care provider can offer advice or refer you to specialists for help with various needs.  Do not use hot tubs, steam rooms, or saunas.  Do not douche or use tampons or scented sanitary pads.  Do not cross your legs for long periods of time.  Avoid cat litter boxes and soil used by cats. These carry germs that can cause birth defects in the baby and possibly loss of the fetus by miscarriage or stillbirth.  Avoid all smoking, herbs, alcohol, and medicines not prescribed by your health care provider. Chemicals in these affect the formation and growth of the baby.  Schedule a dentist appointment. At home, brush your teeth with a soft toothbrush and be gentle when you floss. SEEK MEDICAL CARE IF:   You have  dizziness.  You have mild pelvic cramps, pelvic pressure, or nagging pain in the abdominal area.  You have persistent nausea, vomiting, or diarrhea.  You have a bad smelling vaginal discharge.  You have pain with urination.  You notice increased swelling in your face, hands, legs, or ankles. SEEK IMMEDIATE MEDICAL CARE IF:   You have a fever.  You are leaking fluid from your vagina.  You have spotting or bleeding from your vagina.  You have severe abdominal cramping or pain.  You have rapid weight gain or loss.  You vomit blood or material that looks like coffee grounds.  You are exposed to German measles and have never had them.  You are exposed to fifth disease or chickenpox.  You develop a severe headache.  You have shortness of breath.  You have any kind of trauma, such as from a fall or a car accident. Document Released: 03/18/2001 Document Revised: 08/08/2013 Document Reviewed: 02/01/2013 [employer] Patient Information 2015 [employer]. This information is not intended to replace advice given to you by your health care provider. Make sure you discuss any questions you have with your health care provider.   Nausea & Vomiting  Have saltine crackers or pretzels by your bed and eat a few bites before you raise your head out of bed in the morning  Eat small frequent meals throughout the day instead of large meals  Drink plenty of fluids throughout the day to stay hydrated, just don't drink a lot of fluids with your meals.  This can make your stomach fill up faster making you feel sick  Do not brush your teeth right after you eat  Products with real ginger are good for nausea, like ginger ale and ginger hard candy Make sure it says made with real ginger!  Sucking on sour candy like lemon heads is also good for nausea  If your prenatal vitamins make you nauseated, take them at night so you will sleep through the nausea  Sea Bands  If you feel like you need  medicine for the nausea & vomiting please let us know  If you are unable to keep any fluids or food down please let us know   Constipation  Drink plenty of fluid, preferably water, throughout the day  Eat foods high in fiber such as fruits, vegetables, and grains  Exercise, such as walking, is a good way to keep your bowels regular  Drink warm fluids, especially warm   prune juice, or decaf coffee  Eat a 1/2 cup of real oatmeal (not instant), 1/2 cup applesauce, and 1/2-1 cup warm prune juice every day  If needed, you may take Colace (docusate sodium) stool softener once or twice a day to help keep the stool soft. If you are pregnant, wait until you are out of your first trimester (12-14 weeks of pregnancy)  If you still are having problems with constipation, you may take Miralax once daily as needed to help keep your bowels regular.  If you are pregnant, wait until you are out of your first trimester (12-14 weeks of pregnancy)  Safe Medications in Pregnancy   Acne: Benzoyl Peroxide Salicylic Acid  Backache/Headache: Tylenol: 2 regular strength every 4 hours OR              2 Extra strength every 6 hours  Colds/Coughs/Allergies: Benadryl (alcohol free) 25 mg every 6 hours as needed Breath right strips Claritin Cepacol throat lozenges Chloraseptic throat spray Cold-Eeze- up to three times per day Cough drops, alcohol free Flonase (by prescription only) Guaifenesin Mucinex Robitussin DM (plain only, alcohol free) Saline nasal spray/drops Sudafed (pseudoephedrine) & Actifed ** use only after [redacted] weeks gestation and if you do not have high blood pressure Tylenol Vicks Vaporub Zinc lozenges Zyrtec   Constipation: Colace Ducolax suppositories Fleet enema Glycerin suppositories Metamucil Milk of magnesia Miralax Senokot Smooth move tea  Diarrhea: Kaopectate Imodium A-D  *NO pepto Bismol  Hemorrhoids: Anusol Anusol HC Preparation  H Tucks  Indigestion: Tums Maalox Mylanta Zantac  Pepcid  Insomnia: Benadryl (alcohol free) 25mg every 6 hours as needed Tylenol PM Unisom, no Gelcaps  Leg Cramps: Tums MagGel  Nausea/Vomiting:  Bonine Dramamine Emetrol Ginger extract Sea bands Meclizine  Nausea medication to take during pregnancy:  Unisom (doxylamine succinate 25 mg tablets) Take one tablet daily at bedtime. If symptoms are not adequately controlled, the dose can be increased to a maximum recommended dose of two tablets daily (1/2 tablet in the morning, 1/2 tablet mid-afternoon and one at bedtime). Vitamin B6 100mg tablets. Take one tablet twice a day (up to 200 mg per day).  Skin Rashes: Aveeno products Benadryl cream or 25mg every 6 hours as needed Calamine Lotion 1% cortisone cream  Yeast infection: Gyne-lotrimin 7 Monistat 7   **If taking multiple medications, please check labels to avoid duplicating the same active ingredients **take medication as directed on the label ** Do not exceed 4000 mg of tylenol in 24 hours **Do not take medications that contain aspirin or ibuprofen      

## 2018-03-17 NOTE — Progress Notes (Signed)
INITIAL OBSTETRICAL VISIT Patient name: Kendra Aguilar MRN [id number]  Date of birth: May 25, 1995 Chief Complaint:   Initial Prenatal Visit  History of Present Illness:   Kendra Aguilar is a 22 y.o. 362P0010  female at 5173w6d by LMP/early US with an Estimated Date of Delivery: 10/07/18 being seen today for her initial obstetrical visit.   Her obstetrical history is significant for early SAB. Had spotting a few days ago, [facility]   Today she reports no complaints.  Patient's last menstrual period was 12/25/2017 (exact date). Last pap never Review of Systems:   Pertinent items are noted in HPI Denies cramping/contractions, leakage of fluid, vaginal bleeding, abnormal vaginal discharge w/ itching/odor/irritation, headaches, visual changes, shortness of breath, chest pain, abdominal pain, severe nausea/vomiting, or problems with urination or bowel movements unless otherwise stated above.  Pertinent History Reviewed:  Reviewed past medical,surgical, social, obstetrical and family history.  Reviewed problem list, medications and allergies. OB History  Gravida Para Term Preterm AB Living  2       1    SAB TAB Ectopic Multiple Live Births    1          # Outcome Date GA Lbr Len/2nd Weight Sex Delivery Anes PTL Lv  2 Current           1 TAB            Physical Assessment:   Vitals:   03/17/18 1526  BP: 115/68  Pulse: 82  Weight: 191 lb (86.6 kg)  Body mass index is 31.78 kg/m.       Physical Examination:  General appearance - well appearing, and in no distress  Mental status - alert, oriented to person, place, and time  Psych:  She has a normal mood and affect  Skin - warm and dry, normal color, no suspicious lesions noted  Chest - effort normal, all lung fields clear to auscultation bilaterally  Heart - normal rate and regular rhythm  Abdomen - soft, nontender  Extremities:  No swelling or varicosities noted   Pelvic - VULVA: normal appearing vulva with  no masses, tenderness or lesions  VAGINA: normal appearing vagina with normal color and discharge, no lesions  CERVIX: normal appearing cervix without discharge or lesions, no CMT  Thin prep pap is done withoug HR HPV cotesting     Results for orders placed or performed in visit on 03/17/18 (from the past 24 hour(s))  POC Urinalysis Dipstick OB   Collection Time: 03/17/18  3:42 PM  Result Value Ref Range   Color, UA     Clarity, UA     Glucose, UA Negative Negative   Bilirubin, UA     Ketones, UA neg    Spec Grav, UA     Blood, UA neg    pH, UA     POC,PROTEIN,UA Negative Negative, Trace, Small (1+), Moderate (2+), Large (3+), 4+   Urobilinogen, UA     Nitrite, UA neg    Leukocytes, UA Negative Negative   Appearance     Odor      Assessment & Plan:  1) Low-Risk Pregnancy G2P0010 at 573w6d with an Estimated Date of Delivery: 10/07/18   2) Initial OB visit    Meds: No orders of the defined types were placed in this encounter.   Initial labs obtained Continue prenatal vitamins Reviewed n/v relief measures and warning s/s to report Reviewed recommended weight gain based on pre-gravid BMI Encouraged well-balanced diet  Genetic Screening discussed Integrated Screen: requested Cystic fibrosis screening discussed declined Ultrasound discussed; fetal survey: requested CCNC completed  Follow-up: Return in about 12 days (around 03/29/2018) for LROB, US:NT+1st IT.   Orders Placed This Encounter  Procedures  . Urine Culture  . Obstetric Panel, Including HIV  . Urinalysis, Routine w reflex microscopic  . Sickle cell screen  . POC Urinalysis Dipstick OB    Jacklyn Shell DNP, CNM 03/17/2018 4:05 PM

## 2018-03-19 LAB — OBSTETRIC PANEL, INCLUDING HIV
Basophils Absolute: 0.1 10*3/uL (ref 0.0–0.2)
Basos: 0 %
EOS (ABSOLUTE): 0.1 10*3/uL (ref 0.0–0.4)
Eos: 1 %
HIV Screen 4th Generation wRfx: NONREACTIVE
Hematocrit: 33.7 % — ABNORMAL LOW (ref 34.0–46.6)
Hemoglobin: 11.6 g/dL (ref 11.1–15.9)
Hepatitis B Surface Ag: NEGATIVE
Immature Grans (Abs): 0 10*3/uL (ref 0.0–0.1)
Immature Granulocytes: 0 %
Lymphocytes Absolute: 1.7 10*3/uL (ref 0.7–3.1)
Lymphs: 12 %
MCH: 27.9 pg (ref 26.6–33.0)
MCHC: 34.4 g/dL (ref 31.5–35.7)
MCV: 81 fL (ref 79–97)
Monocytes Absolute: 0.7 10*3/uL (ref 0.1–0.9)
Monocytes: 5 %
NEUTROS ABS: 11.6 10*3/uL — AB (ref 1.4–7.0)
Neutrophils: 82 %
Platelets: 402 10*3/uL (ref 150–450)
RBC: 4.16 x10E6/uL (ref 3.77–5.28)
RDW: 13 % (ref 12.3–15.4)
RH TYPE: NEGATIVE
RPR Ser Ql: NONREACTIVE
Rubella Antibodies, IGG: 3.4 index (ref >0.99)
WBC: 14.1 10*3/uL — ABNORMAL HIGH (ref 3.4–10.8)

## 2018-03-19 LAB — URINALYSIS, ROUTINE W REFLEX MICROSCOPIC
Bilirubin, UA: NEGATIVE
Glucose, UA: NEGATIVE
Ketones, UA: NEGATIVE
Leukocytes, UA: NEGATIVE
Nitrite, UA: NEGATIVE
PH UA: 5.5 (ref 5.0–7.5)
Protein, UA: NEGATIVE
RBC, UA: NEGATIVE
Specific Gravity, UA: >=1.03 — AB (ref 1.005–1.030)
Urobilinogen, Ur: 0.2 mg/dL (ref 0.2–1.0)

## 2018-03-19 LAB — CYTOLOGY - PAP
CHLAMYDIA, DNA PROBE: NEGATIVE
Diagnosis: NEGATIVE
Neisseria Gonorrhea: NEGATIVE

## 2018-03-19 LAB — AB SCR+ANTIBODY ID: Antibody Screen: POSITIVE — AB

## 2018-03-19 LAB — URINE CULTURE

## 2018-03-19 LAB — SICKLE CELL SCREEN: Sickle Cell Screen: NEGATIVE

## 2018-03-23 ENCOUNTER — Other Ambulatory Visit: Payer: Self-pay | Admitting: Obstetrics and Gynecology

## 2018-03-23 DIAGNOSIS — Z3682 Encounter for antenatal screening for nuchal translucency: Secondary | ICD-10-CM

## 2018-03-24 ENCOUNTER — Ambulatory Visit (INDEPENDENT_AMBULATORY_CARE_PROVIDER_SITE_OTHER): Payer: 59

## 2018-03-24 ENCOUNTER — Other Ambulatory Visit: Payer: 59

## 2018-03-24 DIAGNOSIS — Z3682 Encounter for antenatal screening for nuchal translucency: Secondary | ICD-10-CM

## 2018-03-24 DIAGNOSIS — Z3403 Encounter for supervision of normal first pregnancy, third trimester: Secondary | ICD-10-CM

## 2018-03-24 NOTE — Progress Notes (Signed)
US 11+6 wks,measurements c/w dates,crl 52.72 mm,NB present,NT .429mm,fhr 164 bpm,normal right ovary,left ovary not visualized ,anterior placenta gr 0

## 2018-03-26 LAB — INTEGRATED 1
Crown Rump Length: 52.7 mm
Gest. Age on Collection Date: 11.7 weeks
Maternal Age at EDD: 22 yr
Nuchal Translucency (NT): 0.9 mm
Number of Fetuses: 1
PAPP-A VALUE: 636.3 ng/mL
Weight: 189 [lb_av]

## 2018-03-29 ENCOUNTER — Encounter: Payer: 59 | Admitting: Women's Health

## 2018-04-05 ENCOUNTER — Encounter: Payer: Self-pay | Admitting: Women's Health

## 2018-04-05 ENCOUNTER — Ambulatory Visit (INDEPENDENT_AMBULATORY_CARE_PROVIDER_SITE_OTHER): Payer: 59 | Admitting: Women's Health

## 2018-04-05 VITALS — BP 124/68 | HR 85 | Wt 191.0 lb

## 2018-04-05 DIAGNOSIS — Z1389 Encounter for screening for other disorder: Secondary | ICD-10-CM

## 2018-04-05 DIAGNOSIS — R5383 Other fatigue: Secondary | ICD-10-CM | POA: Diagnosis not present

## 2018-04-05 DIAGNOSIS — R448 Other symptoms and signs involving general sensations and perceptions: Secondary | ICD-10-CM

## 2018-04-05 DIAGNOSIS — R6889 Other general symptoms and signs: Secondary | ICD-10-CM | POA: Diagnosis not present

## 2018-04-05 DIAGNOSIS — Z3A13 13 weeks gestation of pregnancy: Secondary | ICD-10-CM

## 2018-04-05 DIAGNOSIS — Z3481 Encounter for supervision of other normal pregnancy, first trimester: Secondary | ICD-10-CM | POA: Diagnosis not present

## 2018-04-05 DIAGNOSIS — Z331 Pregnant state, incidental: Secondary | ICD-10-CM

## 2018-04-05 LAB — POCT URINALYSIS DIPSTICK OB
Glucose, UA: NEGATIVE
Ketones, UA: NEGATIVE
NITRITE UA: NEGATIVE
POC,PROTEIN,UA: NEGATIVE
RBC UA: NEGATIVE

## 2018-04-05 LAB — POCT HEMOGLOBIN: Hemoglobin: 11.8 g/dL (ref 11–14.6)

## 2018-04-05 NOTE — Progress Notes (Signed)
   LOW-RISK PREGNANCY VISIT Patient name: Kendra Aguilar MRN [id number]  Date of birth: 1996/04/06 Chief Complaint:   Routine Prenatal Visit (fatigued all the time)  History of Present Illness:   Kendra Aguilar is a 22 y.o. G2P0010 female at 3437w4d with an Estimated Date of Delivery: 10/07/18 being seen today for ongoing management of a low-risk pregnancy.  Today she reports fatigue, feels cold all the time, burning epigastric pain- hasn't tried anything.  . Vag. Bleeding: None.  Movement: Absent. denies leaking of fluid. Review of Systems:   Pertinent items are noted in HPI Denies abnormal vaginal discharge w/ itching/odor/irritation, headaches, visual changes, shortness of breath, chest pain, abdominal pain, severe nausea/vomiting, or problems with urination or bowel movements unless otherwise stated above. Pertinent History Reviewed:  Reviewed past medical,surgical, social, obstetrical and family history.  Reviewed problem list, medications and allergies. Physical Assessment:   Vitals:   04/05/18 1458  BP: 124/68  Pulse: 85  Weight: 191 lb (86.6 kg)  Body mass index is 31.78 kg/m.        Physical Examination:   General appearance: Well appearing, and in no distress  Mental status: Alert, oriented to person, place, and time  Skin: Warm & dry  Cardiovascular: Normal heart rate noted  Respiratory: Normal respiratory effort, no distress  Abdomen: Soft, gravid, nontender  Pelvic: Cervical exam deferred         Extremities: Edema: None  Fetal Status: Fetal Heart Rate (bpm): 166   Movement: Absent    Results for orders placed or performed in visit on 04/05/18 (from the past 24 hour(s))  POC Urinalysis Dipstick OB   Collection Time: 04/05/18  2:56 PM  Result Value Ref Range   Color, UA     Clarity, UA     Glucose, UA Negative Negative   Bilirubin, UA     Ketones, UA neg    Spec Grav, UA     Blood, UA neg    pH, UA     POC,PROTEIN,UA Negative Negative, Trace, Small (1+),  Moderate (2+), Large (3+), 4+   Urobilinogen, UA     Nitrite, UA neg    Leukocytes, UA Trace (A) Negative   Appearance     Odor      Assessment & Plan:  1) Low-risk pregnancy G2P0010 at 2137w4d with an Estimated Date of Delivery: 10/07/18   2) Fatigue, feels cold, will check TSH, Hgb 11.6 2wks ago  3) Burning epigastric pain> try TUMS or OTC antacid   Meds: No orders of the defined types were placed in this encounter.  Labs/procedures today: TSH, had 1st IT/NT 12/18, too early for 2nd IT   Plan:  Continue routine obstetrical care   Reviewed: Preterm labor symptoms and general obstetric precautions including but not limited to vaginal bleeding, contractions, leaking of fluid and fetal movement were reviewed in detail with the patient.  All questions were answered  Follow-up: Return in about 3 weeks (around 04/26/2018) for LROB, 2nd IT.  Orders Placed This Encounter  Procedures  . TSH  . POC Urinalysis Dipstick OB   Cheral MarkerKimberly R Sylvia Kondracki CNM, [facility]NP[facility]BC 04/05/2018 3:24 PM

## 2018-04-05 NOTE — Addendum Note (Signed)
Addended by: Colen DarlingYOUNG, JANET S on: 04/05/2018 03:31 PM   Modules accepted: Orders

## 2018-04-05 NOTE — Patient Instructions (Signed)
Kendra Aguilar, I greatly value your feedback.  If you receive a survey following your visit with us today, we appreciate you taking the time to fill it out.  Thanks, Kim Booker, CNM, WHNP-BC   Second Trimester of Pregnancy The second trimester is from week 14 through week 27 (months 4 through 6). The second trimester is often a time when you feel your best. Your body has adjusted to being pregnant, and you begin to feel better physically. Usually, morning sickness has lessened or quit completely, you may have more energy, and you may have an increase in appetite. The second trimester is also a time when the fetus is growing rapidly. At the end of the sixth month, the fetus is about 9 inches long and weighs about 1 pounds. You will likely begin to feel the baby move (quickening) between 16 and 20 weeks of pregnancy. Body changes during your second trimester Your body continues to go through many changes during your second trimester. The changes vary from woman to woman.  Your weight will continue to increase. You will notice your lower abdomen bulging out.  You may begin to get stretch marks on your hips, abdomen, and breasts.  You may develop headaches that can be relieved by medicines. The medicines should be approved by your health care provider.  You may urinate more often because the fetus is pressing on your bladder.  You may develop or continue to have heartburn as a result of your pregnancy.  You may develop constipation because certain hormones are causing the muscles that push waste through your intestines to slow down.  You may develop hemorrhoids or swollen, bulging veins (varicose veins).  You may have back pain. This is caused by: ? Weight gain. ? Pregnancy hormones that are relaxing the joints in your pelvis. ? A shift in weight and the muscles that support your balance.  Your breasts will continue to grow and they will continue to become tender.  Your gums may bleed  and may be sensitive to brushing and flossing.  Dark spots or blotches (chloasma, mask of pregnancy) may develop on your face. This will likely fade after the baby is born.  A dark line from your belly button to the pubic area (linea nigra) may appear. This will likely fade after the baby is born.  You may have changes in your hair. These can include thickening of your hair, rapid growth, and changes in texture. Some women also have hair loss during or after pregnancy, or hair that feels dry or thin. Your hair will most likely return to normal after your baby is born.  What to expect at prenatal visits During a routine prenatal visit:  You will be weighed to make sure you and the fetus are growing normally.  Your blood pressure will be taken.  Your abdomen will be measured to track your baby's growth.  The fetal heartbeat will be listened to.  Any test results from the previous visit will be discussed.  Your health care provider may ask you:  How you are feeling.  If you are feeling the baby move.  If you have had any abnormal symptoms, such as leaking fluid, bleeding, severe headaches, or abdominal cramping.  If you are using any tobacco products, including cigarettes, chewing tobacco, and electronic cigarettes.  If you have any questions.  Other tests that may be performed during your second trimester include:  Blood tests that check for: ? Low iron levels (anemia). ? High   blood sugar that affects pregnant women (gestational diabetes) between 3 and 28 weeks. ? Rh antibodies. This is to check for a protein on red blood cells (Rh factor).  Urine tests to check for infections, diabetes, or protein in the urine.  An ultrasound to confirm the proper growth and development of the baby.  An amniocentesis to check for possible genetic problems.  Fetal screens for spina bifida and Down syndrome.  HIV (human immunodeficiency virus) testing. Routine prenatal testing includes  screening for HIV, unless you choose not to have this test.  Follow these instructions at home: Medicines  Follow your health care provider's instructions regarding medicine use. Specific medicines may be either safe or unsafe to take during pregnancy.  Take a prenatal vitamin that contains at least 600 micrograms (mcg) of folic acid.  If you develop constipation, try taking a stool softener if your health care provider approves. Eating and drinking  Eat a balanced diet that includes fresh fruits and vegetables, whole grains, good sources of protein such as meat, eggs, or tofu, and low-fat dairy. Your health care provider will help you determine the amount of weight gain that is right for you.  Avoid raw meat and uncooked cheese. These carry germs that can cause birth defects in the baby.  If you have low calcium intake from food, talk to your health care provider about whether you should take a daily calcium supplement.  Limit foods that are high in fat and processed sugars, such as fried and sweet foods.  To prevent constipation: ? Drink enough fluid to keep your urine clear or pale yellow. ? Eat foods that are high in fiber, such as fresh fruits and vegetables, whole grains, and beans. Activity  Exercise only as directed by your health care provider. Most women can continue their usual exercise routine during pregnancy. Try to exercise for 30 minutes at least 5 days a week. Stop exercising if you experience uterine contractions.  Avoid heavy lifting, wear low heel shoes, and practice good posture.  A sexual relationship may be continued unless your health care provider directs you otherwise. Relieving pain and discomfort  Wear a good support bra to prevent discomfort from breast tenderness.  Take warm sitz baths to soothe any pain or discomfort caused by hemorrhoids. Use hemorrhoid cream if your health care provider approves.  Rest with your legs elevated if you have leg cramps  or low back pain.  If you develop varicose veins, wear support hose. Elevate your feet for 15 minutes, 3-4 times a day. Limit salt in your diet. Prenatal Care  Write down your questions. Take them to your prenatal visits.  Keep all your prenatal visits as told by your health care provider. This is important. Safety  Wear your seat belt at all times when driving.  Make a list of emergency phone numbers, including numbers for family, friends, the hospital, and police and fire departments. General instructions  Ask your health care provider for a referral to a local prenatal education class. Begin classes no later than the beginning of month 6 of your pregnancy.  Ask for help if you have counseling or nutritional needs during pregnancy. Your health care provider can offer advice or refer you to specialists for help with various needs.  Do not use hot tubs, steam rooms, or saunas.  Do not douche or use tampons or scented sanitary pads.  Do not cross your legs for long periods of time.  Avoid cat litter boxes and soil  used by cats. These carry germs that can cause birth defects in the baby and possibly loss of the fetus by miscarriage or stillbirth.  Avoid all smoking, herbs, alcohol, and unprescribed drugs. Chemicals in these products can affect the formation and growth of the baby.  Do not use any products that contain nicotine or tobacco, such as cigarettes and e-cigarettes. If you need help quitting, ask your health care provider.  Visit your dentist if you have not gone yet during your pregnancy. Use a soft toothbrush to brush your teeth and be gentle when you floss. Contact a health care provider if:  You have dizziness.  You have mild pelvic cramps, pelvic pressure, or nagging pain in the abdominal area.  You have persistent nausea, vomiting, or diarrhea.  You have a bad smelling vaginal discharge.  You have pain when you urinate. Get help right away if:  You have a  fever.  You are leaking fluid from your vagina.  You have spotting or bleeding from your vagina.  You have severe abdominal cramping or pain.  You have rapid weight gain or weight loss.  You have shortness of breath with chest pain.  You notice sudden or extreme swelling of your face, hands, ankles, feet, or legs.  You have not felt your baby move in over an hour.  You have severe headaches that do not go away when you take medicine.  You have vision changes. Summary  The second trimester is from week 14 through week 27 (months 4 through 6). It is also a time when the fetus is growing rapidly.  Your body goes through many changes during pregnancy. The changes vary from woman to woman.  Avoid all smoking, herbs, alcohol, and unprescribed drugs. These chemicals affect the formation and growth your baby.  Do not use any tobacco products, such as cigarettes, chewing tobacco, and e-cigarettes. If you need help quitting, ask your health care provider.  Contact your health care provider if you have any questions. Keep all prenatal visits as told by your health care provider. This is important. This information is not intended to replace advice given to you by your health care provider. Make sure you discuss any questions you have with your health care provider. Document Released: 03/18/2001 Document Revised: 08/30/2015 Document Reviewed: 05/25/2012 Elsevier Interactive Patient Education  2017 [employer].

## 2018-04-06 LAB — TSH: TSH: 0.738 u[IU]/mL (ref 0.450–4.500)

## 2018-04-07 NOTE — L&D Delivery Note (Signed)
Delivery Note At 9:39 AM a viable and healthy female was delivered via Vaginal, Spontaneous (Presentation: LOA  ).  APGAR: 9, 9 ; weight pending.   Placenta status: spontaneous , .  Cord: 3 vessel with the following complications: none.    Anesthesia:  epidural Episiotomy: None Lacerations: 2nd degree perineal, first degree periurethral, hemostatic, not repaired Suture Repair: 3.0 monocryl Est. Blood Loss (mL): 200  Mom to postpartum.  Baby to Couplet care / Skin to Skin.  Kendra Aguilar is a 23 y.o. female G2P0010 with IUP at [redacted]w[redacted]d admitted for IOL for GHTN.  She progressed with augmentation to complete and pushed  to deliver. Loose nuchal x 1 reduced at delivery. Cord clamping delayed by 1 minute then clamped by CNM and cut by family member.  Placenta intact and spontaneous, bleeding minimal.  Second degree perineal laceration repaired without difficulty, first degree periurethral hemostatic, not repaired. Foley catheter in place during pushing and was pushed out with delivery with bulb inflated.  Mom and baby stable prior to transfer to postpartum. She plans on breastfeeding. She requests Paraguard for birth control.    Lattie Haw Leftwich-Kirby 09/29/2018, 10:07 AM

## 2018-04-26 ENCOUNTER — Ambulatory Visit (INDEPENDENT_AMBULATORY_CARE_PROVIDER_SITE_OTHER): Payer: 59 | Admitting: Women's Health

## 2018-04-26 ENCOUNTER — Encounter: Payer: Self-pay | Admitting: Women's Health

## 2018-04-26 VITALS — BP 112/64 | HR 82 | Wt 193.0 lb

## 2018-04-26 DIAGNOSIS — Z363 Encounter for antenatal screening for malformations: Secondary | ICD-10-CM

## 2018-04-26 DIAGNOSIS — Z1389 Encounter for screening for other disorder: Secondary | ICD-10-CM

## 2018-04-26 DIAGNOSIS — Z1379 Encounter for other screening for genetic and chromosomal anomalies: Secondary | ICD-10-CM

## 2018-04-26 DIAGNOSIS — Z3A16 16 weeks gestation of pregnancy: Secondary | ICD-10-CM

## 2018-04-26 DIAGNOSIS — Z331 Pregnant state, incidental: Secondary | ICD-10-CM

## 2018-04-26 DIAGNOSIS — Z3482 Encounter for supervision of other normal pregnancy, second trimester: Secondary | ICD-10-CM

## 2018-04-26 LAB — POCT URINALYSIS DIPSTICK OB
Glucose, UA: NEGATIVE
Ketones, UA: NEGATIVE
LEUKOCYTES UA: NEGATIVE
Nitrite, UA: NEGATIVE
POC,PROTEIN,UA: NEGATIVE
RBC UA: NEGATIVE

## 2018-04-26 NOTE — Patient Instructions (Signed)
Kendra Aguilar, I greatly value your feedback.  If you receive a survey following your visit with [place] today, we appreciate you taking the time to fill it out.  Thanks, [name], CNM, WHNP-BC   Second Trimester of Pregnancy The second trimester is from week 14 through week 27 (months 4 through 6). The second trimester is often a time when you feel your best. Your body has adjusted to being pregnant, and you begin to feel better physically. Usually, morning sickness has lessened or quit completely, you may have more energy, and you may have an increase in appetite. The second trimester is also a time when the fetus is growing rapidly. At the end of the sixth month, the fetus is about 9 inches long and weighs about 1 pounds. You will likely begin to feel the baby move (quickening) between 16 and 20 weeks of pregnancy. Body changes during your second trimester Your body continues to go through many changes during your second trimester. The changes vary from woman to woman.  Your weight will continue to increase. You will notice your lower abdomen bulging out.  You may begin to get stretch marks on your hips, abdomen, and breasts.  You may develop headaches that can be relieved by medicines. The medicines should be approved by your health care provider.  You may urinate more often because the fetus is pressing on your bladder.  You may develop or continue to have heartburn as a result of your pregnancy.  You may develop constipation because certain hormones are causing the muscles that push waste through your intestines to slow down.  You may develop hemorrhoids or swollen, bulging veins (varicose veins).  You may have back pain. This is caused by: ? Weight gain. ? Pregnancy hormones that are relaxing the joints in your pelvis. ? A shift in weight and the muscles that support your balance.  Your breasts will continue to grow and they will continue to become tender.  Your gums may bleed  and may be sensitive to brushing and flossing.  Dark spots or blotches (chloasma, mask of pregnancy) may develop on your face. This will likely fade after the baby is born.  A dark line from your belly button to the pubic area (linea nigra) may appear. This will likely fade after the baby is born.  You may have changes in your hair. These can include thickening of your hair, rapid growth, and changes in texture. Some women also have hair loss during or after pregnancy, or hair that feels dry or thin. Your hair will most likely return to normal after your baby is born.  What to expect at prenatal visits During a routine prenatal visit:  You will be weighed to make sure you and the fetus are growing normally.  Your blood pressure will be taken.  Your abdomen will be measured to track your baby's growth.  The fetal heartbeat will be listened to.  Any test results from the previous visit will be discussed.  Your health care provider may ask you:  How you are feeling.  If you are feeling the baby move.  If you have had any abnormal symptoms, such as leaking fluid, bleeding, severe headaches, or abdominal cramping.  If you are using any tobacco products, including cigarettes, chewing tobacco, and electronic cigarettes.  If you have any questions.  Other tests that may be performed during your second trimester include:  Blood tests that check for: ? Low iron levels (anemia). ? High  blood sugar that affects pregnant women (gestational diabetes) between 3 and 28 weeks. ? Rh antibodies. This is to check for a protein on red blood cells (Rh factor).  Urine tests to check for infections, diabetes, or protein in the urine.  An ultrasound to confirm the proper growth and development of the baby.  An amniocentesis to check for possible genetic problems.  Fetal screens for spina bifida and Down syndrome.  HIV (human immunodeficiency virus) testing. Routine prenatal testing includes  screening for HIV, unless you choose not to have this test.  Follow these instructions at home: Medicines  Follow your health care provider's instructions regarding medicine use. Specific medicines may be either safe or unsafe to take during pregnancy.  Take a prenatal vitamin that contains at least 600 micrograms (mcg) of folic acid.  If you develop constipation, try taking a stool softener if your health care provider approves. Eating and drinking  Eat a balanced diet that includes fresh fruits and vegetables, whole grains, good sources of protein such as meat, eggs, or tofu, and low-fat dairy. Your health care provider will help you determine the amount of weight gain that is right for you.  Avoid raw meat and uncooked cheese. These carry germs that can cause birth defects in the baby.  If you have low calcium intake from food, talk to your health care provider about whether you should take a daily calcium supplement.  Limit foods that are high in fat and processed sugars, such as fried and sweet foods.  To prevent constipation: ? Drink enough fluid to keep your urine clear or pale yellow. ? Eat foods that are high in fiber, such as fresh fruits and vegetables, whole grains, and beans. Activity  Exercise only as directed by your health care provider. Most women can continue their usual exercise routine during pregnancy. Try to exercise for 30 minutes at least 5 days a week. Stop exercising if you experience uterine contractions.  Avoid heavy lifting, wear low heel shoes, and practice good posture.  A sexual relationship may be continued unless your health care provider directs you otherwise. Relieving pain and discomfort  Wear a good support bra to prevent discomfort from breast tenderness.  Take warm sitz baths to soothe any pain or discomfort caused by hemorrhoids. Use hemorrhoid cream if your health care provider approves.  Rest with your legs elevated if you have leg cramps  or low back pain.  If you develop varicose veins, wear support hose. Elevate your feet for 15 minutes, 3-4 times a day. Limit salt in your diet. Prenatal Care  Write down your questions. Take them to your prenatal visits.  Keep all your prenatal visits as told by your health care provider. This is important. Safety  Wear your seat belt at all times when driving.  Make a list of emergency phone numbers, including numbers for family, friends, the hospital, and police and fire departments. General instructions  Ask your health care provider for a referral to a local prenatal education class. Begin classes no later than the beginning of month 6 of your pregnancy.  Ask for help if you have counseling or nutritional needs during pregnancy. Your health care provider can offer advice or refer you to specialists for help with various needs.  Do not use hot tubs, steam rooms, or saunas.  Do not douche or use tampons or scented sanitary pads.  Do not cross your legs for long periods of time.  Avoid cat litter boxes and soil  used by cats. These carry germs that can cause birth defects in the baby and possibly loss of the fetus by miscarriage or stillbirth.  Avoid all smoking, herbs, alcohol, and unprescribed drugs. Chemicals in these products can affect the formation and growth of the baby.  Do not use any products that contain nicotine or tobacco, such as cigarettes and e-cigarettes. If you need help quitting, ask your health care provider.  Visit your dentist if you have not gone yet during your pregnancy. Use a soft toothbrush to brush your teeth and be gentle when you floss. Contact a health care provider if:  You have dizziness.  You have mild pelvic cramps, pelvic pressure, or nagging pain in the abdominal area.  You have persistent nausea, vomiting, or diarrhea.  You have a bad smelling vaginal discharge.  You have pain when you urinate. Get help right away if:  You have a  fever.  You are leaking fluid from your vagina.  You have spotting or bleeding from your vagina.  You have severe abdominal cramping or pain.  You have rapid weight gain or weight loss.  You have shortness of breath with chest pain.  You notice sudden or extreme swelling of your face, hands, ankles, feet, or legs.  You have not felt your baby move in over an hour.  You have severe headaches that do not go away when you take medicine.  You have vision changes. Summary  The second trimester is from week 14 through week 27 (months 4 through 6). It is also a time when the fetus is growing rapidly.  Your body goes through many changes during pregnancy. The changes vary from woman to woman.  Avoid all smoking, herbs, alcohol, and unprescribed drugs. These chemicals affect the formation and growth your baby.  Do not use any tobacco products, such as cigarettes, chewing tobacco, and e-cigarettes. If you need help quitting, ask your health care provider.  Contact your health care provider if you have any questions. Keep all prenatal visits as told by your health care provider. This is important. This information is not intended to replace advice given to you by your health care provider. Make sure you discuss any questions you have with your health care provider. Document Released: 03/18/2001 Document Revised: 08/30/2015 Document Reviewed: 05/25/2012 Elsevier Interactive Patient Education  2017 [employer].

## 2018-04-26 NOTE — Progress Notes (Signed)
   LOW-RISK PREGNANCY VISIT Patient name: Kendra Aguilar MRN [id number]  Date of birth: 1995/07/24 Chief Complaint:   Routine Prenatal Visit (2nd IT)  History of Present Illness:   Kendra Aguilar is a 23 y.o. G71P0010 female at [redacted]w[redacted]d with an Estimated Date of Delivery: 10/07/18 being seen today for ongoing management of a low-risk pregnancy.  Today she reports no complaints.  . Vag. Bleeding: None.  Movement: Present. denies leaking of fluid. Review of Systems:   Pertinent items are noted in HPI Denies abnormal vaginal discharge w/ itching/odor/irritation, headaches, visual changes, shortness of breath, chest pain, abdominal pain, severe nausea/vomiting, or problems with urination or bowel movements unless otherwise stated above. Pertinent History Reviewed:  Reviewed past medical,surgical, social, obstetrical and family history.  Reviewed problem list, medications and allergies. Physical Assessment:   Vitals:   04/26/18 1419  BP: 112/64  Pulse: 82  Weight: 193 lb (87.5 kg)  Body mass index is 32.12 kg/m.        Physical Examination:   General appearance: Well appearing, and in no distress  Mental status: Alert, oriented to person, place, and time  Skin: Warm & dry  Cardiovascular: Normal heart rate noted  Respiratory: Normal respiratory effort, no distress  Abdomen: Soft, gravid, nontender  Pelvic: Cervical exam deferred         Extremities: Edema: None  Fetal Status: Fetal Heart Rate (bpm): 150   Movement: Present    Results for orders placed or performed in visit on 04/26/18 (from the past 24 hour(s))  POC Urinalysis Dipstick OB   Collection Time: 04/26/18  2:20 PM  Result Value Ref Range   Color, UA     Clarity, UA     Glucose, UA Negative Negative   Bilirubin, UA     Ketones, UA neg    Spec Grav, UA     Blood, UA neg    pH, UA     POC,PROTEIN,UA Negative Negative, Trace, Small (1+), Moderate (2+), Large (3+), 4+   Urobilinogen, UA     Nitrite, UA neg    Leukocytes, UA Negative Negative   Appearance     Odor      Assessment & Plan:  1) Low-risk pregnancy G2P0010 at [redacted]w[redacted]d with an Estimated Date of Delivery: 10/07/18    Meds: No orders of the defined types were placed in this encounter.  Labs/procedures today: 2nd IT  Plan:  Continue routine obstetrical care   Reviewed: Preterm labor symptoms and general obstetric precautions including but not limited to vaginal bleeding, contractions, leaking of fluid and fetal movement were reviewed in detail with the patient.  All questions were answered  Follow-up: Return in about 2 weeks (around 05/10/2018) for LROB, [id number].  Orders Placed This Encounter  Procedures  . US OB Comp + 14 Wk  . INTEGRATED 2  . POC Urinalysis Dipstick OB   Cheral Marker CNM, [facility] 04/26/2018 2:32 PM

## 2018-04-28 LAB — INTEGRATED 2
AFP MARKER: 25.7 ng/mL
AFP MoM: 0.93
CROWN RUMP LENGTH: 52.7 mm
DIA MoM: 0.74
DIA Value: 104.9 pg/mL
Estriol, Unconjugated: 0.73 ng/mL
Gest. Age on Collection Date: 11.7 weeks
Gestational Age: 16.4 weeks
Maternal Age at EDD: 22 yr
Nuchal Translucency (NT): 0.9 mm
Nuchal Translucency MoM: 0.76
Number of Fetuses: 1
PAPP-A MoM: 1.16
PAPP-A Value: 636.3 ng/mL
TEST RESULTS: NEGATIVE
Weight: 189 [lb_av]
Weight: 193 [lb_av]
hCG MoM: 1.05
hCG Value: 29.4 IU/mL
uE3 MoM: 0.82

## 2018-05-10 ENCOUNTER — Encounter: Payer: Self-pay | Admitting: Obstetrics & Gynecology

## 2018-05-10 ENCOUNTER — Ambulatory Visit (INDEPENDENT_AMBULATORY_CARE_PROVIDER_SITE_OTHER): Payer: 59 | Admitting: Obstetrics & Gynecology

## 2018-05-10 ENCOUNTER — Ambulatory Visit (INDEPENDENT_AMBULATORY_CARE_PROVIDER_SITE_OTHER): Payer: 59

## 2018-05-10 VITALS — BP 115/60 | HR 82 | Wt 192.0 lb

## 2018-05-10 DIAGNOSIS — Z363 Encounter for antenatal screening for malformations: Secondary | ICD-10-CM

## 2018-05-10 DIAGNOSIS — Z1389 Encounter for screening for other disorder: Secondary | ICD-10-CM

## 2018-05-10 DIAGNOSIS — Z331 Pregnant state, incidental: Secondary | ICD-10-CM

## 2018-05-10 DIAGNOSIS — Z3482 Encounter for supervision of other normal pregnancy, second trimester: Secondary | ICD-10-CM

## 2018-05-10 DIAGNOSIS — Z3A18 18 weeks gestation of pregnancy: Secondary | ICD-10-CM

## 2018-05-10 LAB — POCT URINALYSIS DIPSTICK OB
Blood, UA: NEGATIVE
Glucose, UA: NEGATIVE
Ketones, UA: NEGATIVE
Leukocytes, UA: NEGATIVE
NITRITE UA: NEGATIVE
POC,PROTEIN,UA: NEGATIVE

## 2018-05-10 NOTE — Progress Notes (Signed)
   LOW-RISK PREGNANCY VISIT Patient name: Kendra Aguilar MRN [id number]  Date of birth: 1995-05-01 Chief Complaint:   Routine Prenatal Visit ([place] today)  History of Present Illness:   Kendra Aguilar is a 23 y.o. G56P0010 female at [redacted]w[redacted]d with an Estimated Date of Delivery: 10/07/18 being seen today for ongoing management of a low-risk pregnancy.  Today she reports no complaints.  . Vag. Bleeding: None.  Movement: Present. denies leaking of fluid. Review of Systems:   Pertinent items are noted in HPI Denies abnormal vaginal discharge w/ itching/odor/irritation, headaches, visual changes, shortness of breath, chest pain, abdominal pain, severe nausea/vomiting, or problems with urination or bowel movements unless otherwise stated above. Pertinent History Reviewed:  Reviewed past medical,surgical, social, obstetrical and family history.  Reviewed problem list, medications and allergies. Physical Assessment:   Vitals:   05/10/18 1526  BP: 115/60  Pulse: 82  Weight: 192 lb (87.1 kg)  Body mass index is 31.95 kg/m.        Physical Examination:   General appearance: Well appearing, and in no distress  Mental status: Alert, oriented to person, place, and time  Skin: Warm & dry  Cardiovascular: Normal heart rate noted  Respiratory: Normal respiratory effort, no distress  Abdomen: Soft, gravid, nontender  Pelvic: Cervical exam deferred         Extremities: Edema: None  Fetal Status: Fetal Heart Rate (bpm): 148 Fundal Height: 19 cm Movement: Present    Results for orders placed or performed in visit on 05/10/18 (from the past 24 hour(s))  POC Urinalysis Dipstick OB   Collection Time: 05/10/18  3:21 PM  Result Value Ref Range   Color, UA     Clarity, UA     Glucose, UA Negative Negative   Bilirubin, UA     Ketones, UA neg    Spec Grav, UA     Blood, UA neg    pH, UA     POC,PROTEIN,UA Negative Negative, Trace, Small (1+), Moderate (2+), Large (3+), 4+   Urobilinogen, UA     Nitrite, UA neg    Leukocytes, UA Negative Negative   Appearance     Odor      Assessment & Plan:  1) Low-risk pregnancy G2P0010 at [redacted]w[redacted]d with an Estimated Date of Delivery: 10/07/18   2) Rh neg, s/p rhogam with vaginal bleeding in November   Meds: No orders of the defined types were placed in this encounter.  Labs/procedures today: sonogram is normal  Plan:  Continue routine obstetrical care   Reviewed:  labor symptoms and general obstetric precautions including but not limited to vaginal bleeding, contractions, leaking of fluid and fetal movement were reviewed in detail with the patient.  All questions were answered  Follow-up: Return in about 4 weeks (around 06/07/2018) for LROB.  Orders Placed This Encounter  Procedures  . POC Urinalysis Dipstick OB   Lazaro Arms  05/10/2018 3:33 PM

## 2018-05-10 NOTE — Progress Notes (Signed)
[place] 18+4 wks,breech,cx 3.5 cm,anterior placenta gr 0,normal ovaries bilat,svp of fluid 4.6 cm,fhr 148 bpm,efw 251 g,anatomy complete,no obvious abnormalities

## 2018-05-25 ENCOUNTER — Encounter: Payer: Self-pay | Admitting: Women's Health

## 2018-05-25 DIAGNOSIS — Z6791 Unspecified blood type, Rh negative: Secondary | ICD-10-CM | POA: Insufficient documentation

## 2018-05-25 DIAGNOSIS — O26899 Other specified pregnancy related conditions, unspecified trimester: Secondary | ICD-10-CM

## 2018-06-07 ENCOUNTER — Encounter: Payer: Self-pay | Admitting: Women's Health

## 2018-06-07 ENCOUNTER — Ambulatory Visit (INDEPENDENT_AMBULATORY_CARE_PROVIDER_SITE_OTHER): Payer: 59 | Admitting: Women's Health

## 2018-06-07 VITALS — BP 128/81 | HR 78 | Wt 192.0 lb

## 2018-06-07 DIAGNOSIS — Z3A22 22 weeks gestation of pregnancy: Secondary | ICD-10-CM

## 2018-06-07 DIAGNOSIS — Z3482 Encounter for supervision of other normal pregnancy, second trimester: Secondary | ICD-10-CM

## 2018-06-07 DIAGNOSIS — Z331 Pregnant state, incidental: Secondary | ICD-10-CM

## 2018-06-07 LAB — POCT URINALYSIS DIPSTICK OB
Blood, UA: NEGATIVE
Glucose, UA: NEGATIVE
Ketones, UA: NEGATIVE
Leukocytes, UA: NEGATIVE
Nitrite, UA: NEGATIVE
POC,PROTEIN,UA: NEGATIVE

## 2018-06-07 NOTE — Patient Instructions (Addendum)
Kendra Aguilar, I greatly value your feedback.  If you receive a survey following your visit with [place] today, we appreciate you taking the time to fill it out.  Thanks, Kendra Aguilar, CNM, WHNP-BC   You will have your sugar test next visit.  Please do not eat or drink anything after midnight the night before you come, not even water.  You will be here for at least two hours.     Call the office [phone]) or go to [facility] if:  You begin to have strong, frequent contractions  Your water breaks.  Sometimes it is a big gush of fluid, sometimes it is just a trickle that keeps getting your panties wet or running down your legs  You have vaginal bleeding.  It is normal to have a small amount of spotting if your cervix was checked.   You don't feel your baby moving like normal.  If you don't, get you something to eat and drink and lay down and focus on feeling your baby move.   If your baby is still not moving like normal, you should call the office or go to [place] of Pregnancy The second trimester is from week 13 through week 28, months 4 through 6. The second trimester is often a time when you feel your best. Your body has also adjusted to being pregnant, and you begin to feel better physically. Usually, morning sickness has lessened or quit completely, you may have more energy, and you may have an increase in appetite. The second trimester is also a time when the fetus is growing rapidly. At the end of the sixth month, the fetus is about 9 inches long and weighs about 1 pounds. You will likely begin to feel the baby move (quickening) between 18 and 20 weeks of the pregnancy. BODY CHANGES Your body goes through many changes during pregnancy. The changes vary from woman to woman.   Your weight will continue to increase. You will notice your lower abdomen bulging out.  You may begin to get stretch marks on your hips, abdomen, and breasts.  You may develop  headaches that can be relieved by medicines approved by your health care provider.  You may urinate more often because the fetus is pressing on your bladder.  You may develop or continue to have heartburn as a result of your pregnancy.  You may develop constipation because certain hormones are causing the muscles that push waste through your intestines to slow down.  You may develop hemorrhoids or swollen, bulging veins (varicose veins).  You may have back pain because of the weight gain and pregnancy hormones relaxing your joints between the bones in your pelvis and as a result of a shift in weight and the muscles that support your balance.  Your breasts will continue to grow and be tender.  Your gums may bleed and may be sensitive to brushing and flossing.  Dark spots or blotches (chloasma, mask of pregnancy) may develop on your face. This will likely fade after the baby is born.  A dark line from your belly button to the pubic area (linea nigra) may appear. This will likely fade after the baby is born.  You may have changes in your hair. These can include thickening of your hair, rapid growth, and changes in texture. Some women also have hair loss during or after pregnancy, or hair that feels dry or thin. Your hair will most likely return to normal after your  baby is born. WHAT TO EXPECT AT YOUR PRENATAL VISITS During a routine prenatal visit:  You will be weighed to make sure you and the fetus are growing normally.  Your blood pressure will be taken.  Your abdomen will be measured to track your baby's growth.  The fetal heartbeat will be listened to.  Any test results from the previous visit will be discussed. Your health care provider may ask you:  How you are feeling.  If you are feeling the baby move.  If you have had any abnormal symptoms, such as leaking fluid, bleeding, severe headaches, or abdominal cramping.  If you have any questions. Other tests that may be  performed during your second trimester include:  Blood tests that check for:  Low iron levels (anemia).  Gestational diabetes (between 24 and 28 weeks).  Rh antibodies.  Urine tests to check for infections, diabetes, or protein in the urine.  An ultrasound to confirm the proper growth and development of the baby.  An amniocentesis to check for possible genetic problems.  Fetal screens for spina bifida and Down syndrome. HOME CARE INSTRUCTIONS   Avoid all smoking, herbs, alcohol, and unprescribed drugs. These chemicals affect the formation and growth of the baby.  Follow your health care provider's instructions regarding medicine use. There are medicines that are either safe or unsafe to take during pregnancy.  Exercise only as directed by your health care provider. Experiencing uterine cramps is a good sign to stop exercising.  Continue to eat regular, healthy meals.  Wear a good support bra for breast tenderness.  Do not use hot tubs, steam rooms, or saunas.  Wear your seat belt at all times when driving.  Avoid raw meat, uncooked cheese, cat litter boxes, and soil used by cats. These carry germs that can cause birth defects in the baby.  Take your prenatal vitamins.  Try taking a stool softener (if your health care provider approves) if you develop constipation. Eat more high-fiber foods, such as fresh vegetables or fruit and whole grains. Drink plenty of fluids to keep your urine clear or pale yellow.  Take warm sitz baths to soothe any pain or discomfort caused by hemorrhoids. Use hemorrhoid cream if your health care provider approves.  If you develop varicose veins, wear support hose. Elevate your feet for 15 minutes, 3-4 times a day. Limit salt in your diet.  Avoid heavy lifting, wear low heel shoes, and practice good posture.  Rest with your legs elevated if you have leg cramps or low back pain.  Visit your dentist if you have not gone yet during your pregnancy.  Use a soft toothbrush to brush your teeth and be gentle when you floss.  A sexual relationship may be continued unless your health care provider directs you otherwise.  Continue to go to all your prenatal visits as directed by your health care provider. SEEK MEDICAL CARE IF:   You have dizziness.  You have mild pelvic cramps, pelvic pressure, or nagging pain in the abdominal area.  You have persistent nausea, vomiting, or diarrhea.  You have a bad smelling vaginal discharge.  You have pain with urination. SEEK IMMEDIATE MEDICAL CARE IF:   You have a fever.  You are leaking fluid from your vagina.  You have spotting or bleeding from your vagina.  You have severe abdominal cramping or pain.  You have rapid weight gain or loss.  You have shortness of breath with chest pain.  You notice sudden or extreme   swelling of your face, hands, ankles, feet, or legs.  You have not felt your baby move in over an hour.  You have severe headaches that do not go away with medicine.  You have vision changes. Document Released: 03/18/2001 Document Revised: 03/29/2013 Document Reviewed: 05/25/2012 [facility] Patient Information 2015 [place], [place]. This information is not intended to replace advice given to you by your health care provider. Make sure you discuss any questions you have with your health care provider.  Considering Waterbirth? Guide for patients at [facility] [employer]  Why consider waterbirth?  . Gentle birth for babies . Less pain medicine used in labor . May allow for passive descent/less pushing . May reduce perineal tears  . More mobility and instinctive maternal position changes . Increased maternal relaxation . Reduced blood pressure in labor  Is waterbirth safe? What are the risks of infection, drowning or other complications?  . Infection: o Very low risk (3.7 % for tub vs 4.8% for bed) o 7 in 8000 waterbirths with documented infection o Poorly  cleaned equipment most common cause o Slightly lower group B strep transmission rate  . Drowning o Maternal:  - Very low risk   - Related to seizures or fainting o Newborn:  - Very low risk. No evidence of increased risk of respiratory problems in multiple large studies - Physiological protection from breathing under water - Avoid underwater birth if there are any fetal complications - Once baby's head is out of the water, keep it out.  . Birth complication o Some reports of cord trauma, but risk decreased by bringing baby to surface gradually o No evidence of increased risk of shoulder dystocia. Mothers can usually change positions faster in water than in a bed, possibly aiding the maneuvers to free the shoulder.   You must attend a Doren Custard class at [facility]  3rd Wednesday of every month from 7-9pm  [employer] by calling [phone] or online at [email or web address]  Bring [place] the certificate from the class to your prenatal appointment  Meet with a midwife at 36 weeks to see if you can still plan a waterbirth and to sign the consent.   If you plan a waterbirth after May 30, 2018, at [facility], you will need to purchase the following:  Fish net  Bathing suit top (optional)  Long-handled mirror (optional)  If you plan a waterbirth before May 30, 2018: Purchase or rent the following supplies: You are responsible for providing all supplies listed above. **If you do not have all necessary supplies you cannot have a waterbirth.**   Water Birth Pool (Birth Pool in a Box or [place] for instance)  (Tubs start ~$125)  Single-use disposable tub liner designed for your brand of tub  Electric drain pump to remove water (We recommend 792 gallon per hour or greater pump.)   New garden hose labeled "lead-free", "suitable for drinking water",  Separate garden hose to remove the dirty water  Fish net  Bathing  suit top (optional)  Long-handled mirror (optional)  Places to purchase or rent supplies:   [employer] for tub purchases and supplies  [employer]com for tub purchases and supplies  [employer] (www.thelaborladies.com) $275 for tub rental/set-up & take down/kit   [employer] (http://www.fleming.com/.htm) Information regarding doulas (labor support) who provide pool rentals  Things that would prevent you from having a waterbirth:  Premature, <37wks  Previous cesarean birth  Presence of thick meconium-stained fluid  Multiple gestation (Twins, triplets, etc.)  Uncontrolled diabetes or gestational diabetes requiring medication  Hypertension requiring medication or diagnosis of pre-eclampsia  Heavy vaginal bleeding  Non-reassuring fetal heart rate  Active infection (MRSA, etc.). Group B Strep is NOT a contraindication for waterbirth.  If your labor has to be induced and induction method requires continuous monitoring of the baby's heart rate  Other risks/issues identified by your obstetrical provider  Please remember that birth is unpredictable. Under certain unforeseeable circumstances your provider may advise against giving birth in the tub. These decisions will be made on a case-by-case basis and with the safety of you and your baby as our highest priority.

## 2018-06-07 NOTE — Progress Notes (Signed)
   LOW-RISK PREGNANCY VISIT Patient name: Kendra Aguilar MRN [id number]  Date of birth: 01/25/1996 Chief Complaint:   Routine Prenatal Visit  History of Present Illness:   Kendra Aguilar is a 23 y.o. G9P0010 female at [redacted]w[redacted]d with an Estimated Date of Delivery: 10/07/18 being seen today for ongoing management of a low-risk pregnancy.  Today she reports no complaints. Interested in Systems developer.  . Vag. Bleeding: None.  Movement: Present. denies leaking of fluid. Review of Systems:   Pertinent items are noted in HPI Denies abnormal vaginal discharge w/ itching/odor/irritation, headaches, visual changes, shortness of breath, chest pain, abdominal pain, severe nausea/vomiting, or problems with urination or bowel movements unless otherwise stated above. Pertinent History Reviewed:  Reviewed past medical,surgical, social, obstetrical and family history.  Reviewed problem list, medications and allergies. Physical Assessment:   Vitals:   06/07/18 1114  BP: 128/81  Pulse: 78  Weight: 192 lb (87.1 kg)  Body mass index is 31.95 kg/m.        Physical Examination:   General appearance: Well appearing, and in no distress  Mental status: Alert, oriented to person, place, and time  Skin: Warm & dry  Cardiovascular: Normal heart rate noted  Respiratory: Normal respiratory effort, no distress  Abdomen: Soft, gravid, nontender  Pelvic: Cervical exam deferred         Extremities: Edema: None  Fetal Status: Fetal Heart Rate (bpm): 152 Fundal Height: 22 cm Movement: Present    Results for orders placed or performed in visit on 06/07/18 (from the past 24 hour(s))  POC Urinalysis Dipstick OB   Collection Time: 06/07/18 11:16 AM  Result Value Ref Range   Color, UA     Clarity, UA     Glucose, UA Negative Negative   Bilirubin, UA     Ketones, UA neg    Spec Grav, UA     Blood, UA neg    pH, UA     POC,PROTEIN,UA Negative Negative, Trace, Small (1+), Moderate (2+), Large (3+), 4+   Urobilinogen, UA     Nitrite, UA neg    Leukocytes, UA Negative Negative   Appearance     Odor      Assessment & Plan:  1) Low-risk pregnancy G2P0010 at [redacted]w[redacted]d with an Estimated Date of Delivery: 10/07/18   2) Interested in waterbirth, sign up for class, gave printed info   Meds: No orders of the defined types were placed in this encounter.  Labs/procedures today: none  Plan:  Continue routine obstetrical care   Reviewed: Preterm labor symptoms and general obstetric precautions including but not limited to vaginal bleeding, contractions, leaking of fluid and fetal movement were reviewed in detail with the patient.  All questions were answered  Follow-up: Return in about 4 weeks (around 07/05/2018) for LROB, PN2.  Orders Placed This Encounter  Procedures  . POC Urinalysis Dipstick OB   Cheral Marker CNM, [facility] 06/07/2018 11:42 AM

## 2018-06-23 ENCOUNTER — Other Ambulatory Visit: Payer: Self-pay | Admitting: Women's Health

## 2018-06-24 ENCOUNTER — Other Ambulatory Visit: Payer: Self-pay

## 2018-06-24 ENCOUNTER — Ambulatory Visit (INDEPENDENT_AMBULATORY_CARE_PROVIDER_SITE_OTHER): Payer: 59 | Admitting: Family Medicine

## 2018-06-24 ENCOUNTER — Encounter: Payer: Self-pay | Admitting: Family Medicine

## 2018-06-24 VITALS — BP 112/64 | Ht 65.25 in | Wt 195.6 lb

## 2018-06-24 DIAGNOSIS — Z0000 Encounter for general adult medical examination without abnormal findings: Secondary | ICD-10-CM | POA: Diagnosis not present

## 2018-06-24 NOTE — Progress Notes (Signed)
   Subjective:    Patient ID: Kendra Aguilar, female    DOB: [birth year], 23 y.o.   MRN: [id number]  HPI The patient comes in today for a wellness visit.    A review of their health history was completed.  A review of medications was also completed.  Any needed refills; none   Eating habits: eating well  Falls/  MVA accidents in past few months: none  Regular exercise: walking dog and work   Barrister's clerk pt sees on regular basis: OBGYN  Preventative health issues were discussed.   Additional concerns: Pt reduced Zoloft to 50 mg per OB request. Pt is pregnant and wanting to know if child can be seen by Dr.Scott as PCP  Review of Systems  Constitutional: Negative for activity change, appetite change and fatigue.  HENT: Negative for congestion and rhinorrhea.   Respiratory: Negative for cough and shortness of breath.   Cardiovascular: Negative for chest pain and leg swelling.  Gastrointestinal: Negative for abdominal pain and diarrhea.  Endocrine: Negative for polydipsia and polyphagia.  Skin: Negative for color change.  Neurological: Negative for dizziness and weakness.  Psychiatric/Behavioral: Negative for behavioral problems and confusion.       Objective:   Physical Exam Vitals signs reviewed.  Constitutional:      General: She is not in acute distress. HENT:     Head: Normocephalic and atraumatic.  Eyes:     General:        Right eye: No discharge.        Left eye: No discharge.  Neck:     Trachea: No tracheal deviation.  Cardiovascular:     Rate and Rhythm: Normal rate and regular rhythm.     Heart sounds: Normal heart sounds. No murmur.  Pulmonary:     Effort: Pulmonary effort is normal. No respiratory distress.     Breath sounds: Normal breath sounds.  Lymphadenopathy:     Cervical: No cervical adenopathy.  Skin:    General: Skin is warm and dry.  Neurological:     Mental Status: She is alert.     Coordination: Coordination normal.  Psychiatric:         Behavior: Behavior normal.        Gets GYN exam hrough her gynecologist    Assessment & Plan:  Adult wellness-complete.wellness physical was conducted today. Importance of diet and exercise were discussed in detail.  In addition to this a discussion regarding safety was also covered. We also reviewed over immunizations and gave recommendations regarding current immunization needed for age.  In addition to this additional areas were also touched on including: Preventative health exams needed:  Colonoscopy not indicated  Patient was advised yearly wellness exam

## 2018-07-05 ENCOUNTER — Other Ambulatory Visit: Payer: Self-pay

## 2018-07-05 ENCOUNTER — Encounter: Payer: Self-pay | Admitting: Women's Health

## 2018-07-05 ENCOUNTER — Other Ambulatory Visit: Payer: 59

## 2018-07-05 ENCOUNTER — Ambulatory Visit (INDEPENDENT_AMBULATORY_CARE_PROVIDER_SITE_OTHER): Payer: 59 | Admitting: Women's Health

## 2018-07-05 VITALS — BP 107/58 | HR 84 | Wt 198.2 lb

## 2018-07-05 DIAGNOSIS — Z3A26 26 weeks gestation of pregnancy: Secondary | ICD-10-CM | POA: Diagnosis not present

## 2018-07-05 DIAGNOSIS — Z3482 Encounter for supervision of other normal pregnancy, second trimester: Secondary | ICD-10-CM | POA: Diagnosis not present

## 2018-07-05 DIAGNOSIS — Z1389 Encounter for screening for other disorder: Secondary | ICD-10-CM

## 2018-07-05 DIAGNOSIS — Z131 Encounter for screening for diabetes mellitus: Secondary | ICD-10-CM | POA: Diagnosis not present

## 2018-07-05 DIAGNOSIS — Z331 Pregnant state, incidental: Secondary | ICD-10-CM

## 2018-07-05 LAB — POCT URINALYSIS DIPSTICK OB
Blood, UA: NEGATIVE
Glucose, UA: NEGATIVE
Ketones, UA: NEGATIVE
Leukocytes, UA: NEGATIVE
Nitrite, UA: NEGATIVE
POC,PROTEIN,UA: NEGATIVE

## 2018-07-05 NOTE — Progress Notes (Signed)
   LOW-RISK PREGNANCY VISIT Patient name: Kendra Aguilar MRN [id number]  Date of birth: Apr 27, 1995 Chief Complaint:   Routine Prenatal Visit (PN2)  History of Present Illness:   Kendra Aguilar is a 23 y.o. G63P0010 female at [redacted]w[redacted]d with an Estimated Date of Delivery: 10/07/18 being seen today for ongoing management of a low-risk pregnancy.  Today she reports no complaints. Can check bp at work at [employer].  Contractions: Regular.  .  Movement: Present. denies leaking of fluid. Review of Systems:   Pertinent items are noted in HPI Denies abnormal vaginal discharge w/ itching/odor/irritation, headaches, visual changes, shortness of breath, chest pain, abdominal pain, severe nausea/vomiting, or problems with urination or bowel movements unless otherwise stated above. Pertinent History Reviewed:  Reviewed past medical,surgical, social, obstetrical and family history.  Reviewed problem list, medications and allergies. Physical Assessment:   Vitals:   07/05/18 0925  BP: (!) 107/58  Pulse: 84  Weight: 198 lb 3.2 oz (89.9 kg)  Body mass index is 32.73 kg/m.        Physical Examination:   General appearance: Well appearing, and in no distress  Mental status: Alert, oriented to person, place, and time  Skin: Warm & dry  Cardiovascular: Normal heart rate noted  Respiratory: Normal respiratory effort, no distress  Abdomen: Soft, gravid, nontender  Pelvic: Cervical exam deferred         Extremities: Edema: Trace  Fetal Status: Fetal Heart Rate (bpm): 141 Fundal Height: 27 cm Movement: Present    Results for orders placed or performed in visit on 07/05/18 (from the past 24 hour(s))  POC Urinalysis Dipstick OB   Collection Time: 07/05/18  9:32 AM  Result Value Ref Range   Color, UA     Clarity, UA     Glucose, UA Negative Negative   Bilirubin, UA     Ketones, UA neg    Spec Grav, UA     Blood, UA neg    pH, UA     POC,PROTEIN,UA Negative Negative, Trace, Small (1+), Moderate (2+), Large  (3+), 4+   Urobilinogen, UA     Nitrite, UA neg    Leukocytes, UA Negative Negative   Appearance     Odor      Assessment & Plan:  1) Low-risk pregnancy G2P0010 at [redacted]w[redacted]d with an Estimated Date of Delivery: 10/07/18    Meds: No orders of the defined types were placed in this encounter.  Labs/procedures today: pn2  Plan:  Continue routine obstetrical care   Reviewed: Preterm labor symptoms and general obstetric precautions including but not limited to vaginal bleeding, contractions, leaking of fluid and fetal movement were reviewed in detail with the patient.  All questions were answered  Follow-up: Return in about 1 week (around 07/12/2018) for nurse visit for tdap and rhogam, then 4wks for tele visit. d/t coronavirus. Check bp weekly at work, let us know if >140/90.   Orders Placed This Encounter  Procedures  . POC Urinalysis Dipstick OB   Cheral Marker CNM, [facility] 07/05/2018 10:04 AM

## 2018-07-05 NOTE — Patient Instructions (Addendum)
Kendra Aguilar, I greatly value your feedback.  If you receive a survey following your visit with [place] today, we appreciate you taking the time to fill it out.  Thanks, [name], CNM, [facility]  [place]!!! It is now [facility] [place] at [facility] ([place], [place] [address]) Entrance located off of [address] parking   Check your blood pressure once a week. If top > or = 140 or bottom > or = 90, call and let us know.     Call the office [phone]) or go to [facility] if:  You begin to have strong, frequent contractions  Your water breaks.  Sometimes it is a big gush of fluid, sometimes it is just a trickle that keeps getting your panties wet or running down your legs  You have vaginal bleeding.  It is normal to have a small amount of spotting if your cervix was checked.   You don't feel your baby moving like normal.  If you don't, get you something to eat and drink and lay down and focus on feeling your baby move.  You should feel at least 10 movements in 2 hours.  If you don't, you should call the office or go to [facility].    Tdap Vaccine  It is recommended that you get the Tdap vaccine during the third trimester of EACH pregnancy to help protect your baby from getting pertussis (whooping cough)  27-36 weeks is the BEST time to do this so that you can pass the protection on to your baby. During pregnancy is better than after pregnancy, but if you are unable to get it during pregnancy it will be offered at the hospital.   You can get this vaccine with [place], at the health department, your family doctor, or some local pharmacies  Everyone who will be around your baby should also be up-to-date on their vaccines before the baby comes. Adults (who are not pregnant) only need 1 dose of Tdap during adulthood.   Third Trimester of Pregnancy The third trimester is from week 29 through week 42, months 7 through 9. The  third trimester is a time when the fetus is growing rapidly. At the end of the ninth month, the fetus is about 20 inches in length and weighs 6-10 pounds.  BODY CHANGES Your body goes through many changes during pregnancy. The changes vary from woman to woman.   Your weight will continue to increase. You can expect to gain 25-35 pounds (11-16 kg) by the end of the pregnancy.  You may begin to get stretch marks on your hips, abdomen, and breasts.  You may urinate more often because the fetus is moving lower into your pelvis and pressing on your bladder.  You may develop or continue to have heartburn as a result of your pregnancy.  You may develop constipation because certain hormones are causing the muscles that push waste through your intestines to slow down.  You may develop hemorrhoids or swollen, bulging veins (varicose veins).  You may have pelvic pain because of the weight gain and pregnancy hormones relaxing your joints between the bones in your pelvis. Backaches may result from overexertion of the muscles supporting your posture.  You may have changes in your hair. These can include thickening of your hair, rapid growth, and changes in texture. Some women also have hair loss during or after pregnancy, or hair that feels dry or thin. Your  hair will most likely return to normal after your baby is born.  Your breasts will continue to grow and be tender. A yellow discharge may leak from your breasts called colostrum.  Your belly button may stick out.  You may feel short of breath because of your expanding uterus.  You may notice the fetus "dropping," or moving lower in your abdomen.  You may have a bloody mucus discharge. This usually occurs a few days to a week before labor begins.  Your cervix becomes thin and soft (effaced) near your due date. WHAT TO EXPECT AT YOUR PRENATAL EXAMS  You will have prenatal exams every 2 weeks until week 36. Then, you will have weekly prenatal  exams. During a routine prenatal visit:  You will be weighed to make sure you and the fetus are growing normally.  Your blood pressure is taken.  Your abdomen will be measured to track your baby's growth.  The fetal heartbeat will be listened to.  Any test results from the previous visit will be discussed.  You may have a cervical check near your due date to see if you have effaced. At around 36 weeks, your caregiver will check your cervix. At the same time, your caregiver will also perform a test on the secretions of the vaginal tissue. This test is to determine if a type of bacteria, Group B streptococcus, is present. Your caregiver will explain this further. Your caregiver may ask you:  What your birth plan is.  How you are feeling.  If you are feeling the baby move.  If you have had any abnormal symptoms, such as leaking fluid, bleeding, severe headaches, or abdominal cramping.  If you have any questions. Other tests or screenings that may be performed during your third trimester include:  Blood tests that check for low iron levels (anemia).  Fetal testing to check the health, activity level, and growth of the fetus. Testing is done if you have certain medical conditions or if there are problems during the pregnancy. FALSE LABOR You may feel small, irregular contractions that eventually go away. These are called Braxton Hicks contractions, or false labor. Contractions may last for hours, days, or even weeks before true labor sets in. If contractions come at regular intervals, intensify, or become painful, it is best to be seen by your caregiver.  SIGNS OF LABOR   Menstrual-like cramps.  Contractions that are 5 minutes apart or less.  Contractions that start on the top of the uterus and spread down to the lower abdomen and back.  A sense of increased pelvic pressure or back pain.  A watery or bloody mucus discharge that comes from the vagina. If you have any of these  signs before the 37th week of pregnancy, call your caregiver right away. You need to go to the hospital to get checked immediately. HOME CARE INSTRUCTIONS   Avoid all smoking, herbs, alcohol, and unprescribed drugs. These chemicals affect the formation and growth of the baby.  Follow your caregiver's instructions regarding medicine use. There are medicines that are either safe or unsafe to take during pregnancy.  Exercise only as directed by your caregiver. Experiencing uterine cramps is a good sign to stop exercising.  Continue to eat regular, healthy meals.  Wear a good support bra for breast tenderness.  Do not use hot tubs, steam rooms, or saunas.  Wear your seat belt at all times when driving.  Avoid raw meat, uncooked cheese, cat litter boxes, and soil used by  cats. These carry germs that can cause birth defects in the baby.  Take your prenatal vitamins.  Try taking a stool softener (if your caregiver approves) if you develop constipation. Eat more high-fiber foods, such as fresh vegetables or fruit and whole grains. Drink plenty of fluids to keep your urine clear or pale yellow.  Take warm sitz baths to soothe any pain or discomfort caused by hemorrhoids. Use hemorrhoid cream if your caregiver approves.  If you develop varicose veins, wear support hose. Elevate your feet for 15 minutes, 3-4 times a day. Limit salt in your diet.  Avoid heavy lifting, wear low heal shoes, and practice good posture.  Rest a lot with your legs elevated if you have leg cramps or low back pain.  Visit your dentist if you have not gone during your pregnancy. Use a soft toothbrush to brush your teeth and be gentle when you floss.  A sexual relationship may be continued unless your caregiver directs you otherwise.  Do not travel far distances unless it is absolutely necessary and only with the approval of your caregiver.  Take prenatal classes to understand, practice, and ask questions about the  labor and delivery.  Make a trial run to the hospital.  Pack your hospital bag.  Prepare the baby's nursery.  Continue to go to all your prenatal visits as directed by your caregiver. SEEK MEDICAL CARE IF:  You are unsure if you are in labor or if your water has broken.  You have dizziness.  You have mild pelvic cramps, pelvic pressure, or nagging pain in your abdominal area.  You have persistent nausea, vomiting, or diarrhea.  You have a bad smelling vaginal discharge.  You have pain with urination. SEEK IMMEDIATE MEDICAL CARE IF:   You have a fever.  You are leaking fluid from your vagina.  You have spotting or bleeding from your vagina.  You have severe abdominal cramping or pain.  You have rapid weight loss or gain.  You have shortness of breath with chest pain.  You notice sudden or extreme swelling of your face, hands, ankles, feet, or legs.  You have not felt your baby move in over an hour.  You have severe headaches that do not go away with medicine.  You have vision changes. Document Released: 03/18/2001 Document Revised: 03/29/2013 Document Reviewed: 05/25/2012 [facility] Patient Information 2015 [place], [place]. This information is not intended to replace advice given to you by your health care provider. Make sure you discuss any questions you have with your health care provider.  Considering Waterbirth? Guide for patients at [facility] for [employer]  Why consider waterbirth?  . Gentle birth for babies . Less pain medicine used in labor . May allow for passive descent/less pushing . May reduce perineal tears  . More mobility and instinctive maternal position changes . Increased maternal relaxation . Reduced blood pressure in labor  Is waterbirth safe? What are the risks of infection, drowning or other complications?  . Infection: o Very low risk (3.7 % for tub vs 4.8% for bed) o 7 in 8000 waterbirths with documented infection o Poorly  cleaned equipment most common cause o Slightly lower group B strep transmission rate  . Drowning o Maternal:  - Very low risk   - Related to seizures or fainting o Newborn:  - Very low risk. No evidence of increased risk of respiratory problems in multiple large studies - Physiological protection from breathing under water - Avoid underwater birth if there are  any fetal complications - Once baby's head is out of the water, keep it out.  . Birth complication o Some reports of cord trauma, but risk decreased by bringing baby to surface gradually o No evidence of increased risk of shoulder dystocia. Mothers can usually change positions faster in water than in a bed, possibly aiding the maneuvers to free the shoulder.   You must attend a Doren Custard class at [facility]  3rd Wednesday of every month from 7-9pm  [employer] by calling [phone] or online at [email or web address]  Bring [place] the certificate from the class to your prenatal appointment  Meet with a midwife at 36 weeks to see if you can still plan a waterbirth and to sign the consent.   If you plan a waterbirth after May 30, 2018, at [facility] at [facility], you will need to purchase the following:  Fish net  Bathing suit top (optional)  Long-handled mirror (optional)  If you plan a waterbirth before May 30, 2018: Purchase or rent the following supplies: You are responsible for providing all supplies listed above. **If you do not have all necessary supplies you cannot have a waterbirth.**   Water Birth Pool (Birth Pool in a Box or [place] for instance)  (Tubs start ~$125)  Single-use disposable tub liner designed for your brand of tub  Electric drain pump to remove water (We recommend 792 gallon per hour or greater pump.)   New garden hose labeled "lead-free", "suitable for drinking water",  Separate garden hose to remove the dirty water  Fish net  Bathing  suit top (optional)  Long-handled mirror (optional)  Places to purchase or rent supplies:   [employer] for tub purchases and supplies  [employer] for tub purchases and supplies  [employer] (www.thelaborladies.com) $275 for tub rental/set-up & take down/kit   [employer] (http://www.fleming.com/.htm) Information regarding doulas (labor support) who provide pool rentals  Things that would prevent you from having a waterbirth:  Premature, <37wks  Previous cesarean birth  Presence of thick meconium-stained fluid  Multiple gestation (Twins, triplets, etc.)  Uncontrolled diabetes or gestational diabetes requiring medication  Hypertension requiring medication or diagnosis of pre-eclampsia  Heavy vaginal bleeding  Non-reassuring fetal heart rate  Active infection (MRSA, etc.). Group B Strep is NOT a contraindication for waterbirth.  If your labor has to be induced and induction method requires continuous monitoring of the baby's heart rate  Other risks/issues identified by your obstetrical provider  Please remember that birth is unpredictable. Under certain unforeseeable circumstances your provider may advise against giving birth in the tub. These decisions will be made on a case-by-case basis and with the safety of you and your baby as our highest priority.

## 2018-07-06 LAB — CBC
Hematocrit: 35 % (ref 34.0–46.6)
Hemoglobin: 11.5 g/dL (ref 11.1–15.9)
MCH: 27.7 pg (ref 26.6–33.0)
MCHC: 32.9 g/dL (ref 31.5–35.7)
MCV: 84 fL (ref 79–97)
Platelets: 343 10*3/uL (ref 150–450)
RBC: 4.15 x10E6/uL (ref 3.77–5.28)
RDW: 11.9 % (ref 11.7–15.4)
WBC: 13 10*3/uL — ABNORMAL HIGH (ref 3.4–10.8)

## 2018-07-06 LAB — GLUCOSE TOLERANCE, 2 HOURS W/ 1HR
GLUCOSE, 1 HOUR: 120 mg/dL (ref 65–179)
Glucose, 2 hour: 107 mg/dL (ref 65–152)
Glucose, Fasting: 74 mg/dL (ref 65–91)

## 2018-07-06 LAB — RPR: RPR Ser Ql: NONREACTIVE

## 2018-07-06 LAB — ANTIBODY SCREEN: Antibody Screen: NEGATIVE

## 2018-07-06 LAB — HIV ANTIBODY (ROUTINE TESTING W REFLEX): HIV SCREEN 4TH GENERATION: NONREACTIVE

## 2018-07-12 ENCOUNTER — Ambulatory Visit (INDEPENDENT_AMBULATORY_CARE_PROVIDER_SITE_OTHER): Payer: 59

## 2018-07-12 ENCOUNTER — Other Ambulatory Visit: Payer: Self-pay

## 2018-07-12 VITALS — BP 126/75 | HR 106 | Ht 65.0 in | Wt 201.0 lb

## 2018-07-12 DIAGNOSIS — Z1389 Encounter for screening for other disorder: Secondary | ICD-10-CM

## 2018-07-12 DIAGNOSIS — Z3A27 27 weeks gestation of pregnancy: Secondary | ICD-10-CM

## 2018-07-12 DIAGNOSIS — Z3482 Encounter for supervision of other normal pregnancy, second trimester: Secondary | ICD-10-CM

## 2018-07-12 DIAGNOSIS — O360120 Maternal care for anti-D [Rh] antibodies, second trimester, not applicable or unspecified: Secondary | ICD-10-CM | POA: Diagnosis not present

## 2018-07-12 DIAGNOSIS — O26899 Other specified pregnancy related conditions, unspecified trimester: Secondary | ICD-10-CM

## 2018-07-12 DIAGNOSIS — Z23 Encounter for immunization: Secondary | ICD-10-CM

## 2018-07-12 DIAGNOSIS — Z6791 Unspecified blood type, Rh negative: Secondary | ICD-10-CM

## 2018-07-12 DIAGNOSIS — Z331 Pregnant state, incidental: Secondary | ICD-10-CM

## 2018-07-12 LAB — POCT URINALYSIS DIPSTICK OB
Blood, UA: NEGATIVE
Glucose, UA: NEGATIVE
Ketones, UA: NEGATIVE
Leukocytes, UA: NEGATIVE
Nitrite, UA: NEGATIVE
POC,PROTEIN,UA: NEGATIVE

## 2018-07-12 MED ORDER — RHO D IMMUNE GLOBULIN 1500 UNIT/2ML IJ SOSY: 300.0000 ug | PREFILLED_SYRINGE | Freq: Once | INTRAMUSCULAR | Status: DC

## 2018-07-12 NOTE — Progress Notes (Signed)
Pt here for Rhogam injection given rt deltoid. Tolerated well. A negative blood. Return 4 weeks for tele- visit.Pad CMA

## 2018-07-28 ENCOUNTER — Encounter: Payer: Self-pay | Admitting: *Deleted

## 2018-08-02 ENCOUNTER — Other Ambulatory Visit: Payer: Self-pay

## 2018-08-02 ENCOUNTER — Encounter: Payer: Self-pay | Admitting: Obstetrics and Gynecology

## 2018-08-02 ENCOUNTER — Ambulatory Visit (INDEPENDENT_AMBULATORY_CARE_PROVIDER_SITE_OTHER): Payer: 59 | Admitting: Obstetrics and Gynecology

## 2018-08-02 VITALS — BP 123/76 | HR 87 | Wt 203.0 lb

## 2018-08-02 DIAGNOSIS — Z3A30 30 weeks gestation of pregnancy: Secondary | ICD-10-CM

## 2018-08-02 DIAGNOSIS — O26893 Other specified pregnancy related conditions, third trimester: Secondary | ICD-10-CM

## 2018-08-02 DIAGNOSIS — O26899 Other specified pregnancy related conditions, unspecified trimester: Secondary | ICD-10-CM

## 2018-08-02 DIAGNOSIS — Z6791 Unspecified blood type, Rh negative: Secondary | ICD-10-CM

## 2018-08-02 DIAGNOSIS — Z3482 Encounter for supervision of other normal pregnancy, second trimester: Secondary | ICD-10-CM

## 2018-08-02 DIAGNOSIS — O360930 Maternal care for other rhesus isoimmunization, third trimester, not applicable or unspecified: Secondary | ICD-10-CM

## 2018-08-02 NOTE — Progress Notes (Signed)
   TELEHEALTH VIRTUAL OBSTETRICS VISIT ENCOUNTER NOTE  I connected with Kendra Aguilar on 08/02/18 at  9:15 AM EDT by telephone at home and verified that I am speaking with the correct person using two identifiers.   I discussed the limitations, risks, security and privacy concerns of performing an evaluation and management service by telephone and the availability of in person appointments. I also discussed with the patient that there may be a patient responsible charge related to this service. The patient expressed understanding and agreed to proceed.  Subjective:  Kendra Aguilar is a 23 y.o. G2P0010 at [redacted]w[redacted]d being followed for ongoing prenatal care.  She is currently monitored for the following issues for this low-risk pregnancy and has CHICKENPOX, HX OF; Suicidal thoughts; Migraine; Depression with anxiety; Supervision of normal pregnancy; and Rh negative state in antepartum period on their problem list.  Patient reports occ BH contractions, denies VB or LOF. Reports fetal movement. Denies any contractions, bleeding or leaking of fluid.   The following portions of the patient's history were reviewed and updated as appropriate: allergies, current medications, past family history, past medical history, past social history, past surgical history and problem list.   Objective:   General:  Alert, oriented and cooperative.   Mental Status: Normal mood and affect perceived. Normal judgment and thought content.  Rest of physical exam deferred due to type of encounter  Assessment and Plan:  Pregnancy: G2P0010 at [redacted]w[redacted]d 1. Encounter for supervision of other normal pregnancy in second trimester Stable Pt has BP cuff to monitor BP BP monitoring reviewed with pt  2. Rh negative state in antepartum period S/P Rhogam  Preterm labor symptoms and general obstetric precautions including but not limited to vaginal bleeding, contractions, leaking of fluid and fetal movement were reviewed in  detail with the patient.  I discussed the assessment and treatment plan with the patient. The patient was provided an opportunity to ask questions and all were answered. The patient agreed with the plan and demonstrated an understanding of the instructions. The patient was advised to call back or seek an in-person office evaluation/go to [facility] at [facility] for any urgent or concerning symptoms. Please refer to After Visit Summary for other counseling recommendations.   I provided 11 minutes of non-face-to-face time during this encounter.  Return in about 4 weeks (around 08/30/2018) for OB visit, televisit.  No future appointments.  Hermina Staggers, MD [facility] for [facility], [facility]

## 2018-08-05 ENCOUNTER — Encounter: Payer: Self-pay | Admitting: Family Medicine

## 2018-08-09 NOTE — Telephone Encounter (Signed)
Please feel free to communicate with the patient I am somewhat confused on who is requesting the FMLA? Is it [place] or her mother? How long are they requesting this for? May well need a virtual visit to discuss further so that we can have the proper amount of documentation

## 2018-08-31 ENCOUNTER — Ambulatory Visit (INDEPENDENT_AMBULATORY_CARE_PROVIDER_SITE_OTHER): Payer: 59 | Admitting: Women's Health

## 2018-08-31 ENCOUNTER — Encounter: Payer: Self-pay | Admitting: Women's Health

## 2018-08-31 ENCOUNTER — Other Ambulatory Visit: Payer: Self-pay

## 2018-08-31 VITALS — BP 131/77 | HR 83 | Wt 216.0 lb

## 2018-08-31 DIAGNOSIS — Z6791 Unspecified blood type, Rh negative: Secondary | ICD-10-CM

## 2018-08-31 DIAGNOSIS — O360930 Maternal care for other rhesus isoimmunization, third trimester, not applicable or unspecified: Secondary | ICD-10-CM

## 2018-08-31 DIAGNOSIS — Z3483 Encounter for supervision of other normal pregnancy, third trimester: Secondary | ICD-10-CM

## 2018-08-31 DIAGNOSIS — O26893 Other specified pregnancy related conditions, third trimester: Secondary | ICD-10-CM

## 2018-08-31 DIAGNOSIS — Z3A34 34 weeks gestation of pregnancy: Secondary | ICD-10-CM

## 2018-08-31 NOTE — Progress Notes (Signed)
   LOW-RISK PREGNANCY VISIT Patient name: Kendra Aguilar MRN [id number]  Date of birth: 1995-05-11 Chief Complaint:   Routine Prenatal Visit  History of Present Illness:   Kendra Aguilar is a 23 y.o. G94P0010 female at [redacted]w[redacted]d with an Estimated Date of Delivery: 10/07/18 being seen today for ongoing management of a low-risk pregnancy.  Today she reports itchy rash belly & arms, using bio oil . Contractions: Not present. Vag. Bleeding: None.  Movement: Present. denies leaking of fluid. Review of Systems:   Pertinent items are noted in HPI Denies abnormal vaginal discharge w/ itching/odor/irritation, headaches, visual changes, shortness of breath, chest pain, abdominal pain, severe nausea/vomiting, or problems with urination or bowel movements unless otherwise stated above. Pertinent History Reviewed:  Reviewed past medical,surgical, social, obstetrical and family history.  Reviewed problem list, medications and allergies. Physical Assessment:   Vitals:   08/31/18 0936  BP: 131/77  Pulse: 83  Weight: 216 lb (98 kg)  Body mass index is 35.94 kg/m.        Physical Examination:   General appearance: Well appearing, and in no distress  Mental status: Alert, oriented to person, place, and time  Skin: Warm & dry  Cardiovascular: Normal heart rate noted  Respiratory: Normal respiratory effort, no distress  Abdomen: Soft, gravid, nontender, red rash in striae c/w PUPPS  Pelvic: Cervical exam deferred         Extremities: Edema: Trace, fine red rash arms  Fetal Status: Fetal Heart Rate (bpm): 147 Fundal Height: 34 cm Movement: Present    No results found for this or any previous visit (from the past 24 hour(s)).  Assessment & Plan:  1) Low-risk pregnancy G2P0010 at [redacted]w[redacted]d with an Estimated Date of Delivery: 10/07/18   2) PUPPS, declines prednisone, gave list of itch relief measures   Meds: No orders of the defined types were placed in this encounter.  Labs/procedures today: none   Plan:  Continue routine obstetrical care   Reviewed: Preterm labor symptoms and general obstetric precautions including but not limited to vaginal bleeding, contractions, leaking of fluid and fetal movement were reviewed in detail with the patient.  All questions were answered. Has acces to bp cuff. Check bp weekly, let us know if >140/90.   Follow-up: Return in about 2 weeks (around 09/14/2018) for LROB in person.  No orders of the defined types were placed in this encounter.  Cheral Marker CNM, [facility] 08/31/2018 9:59 AM

## 2018-08-31 NOTE — Patient Instructions (Addendum)
Kendra Aguilar, I greatly value your feedback.  If you receive a survey following your visit with [place] today, we appreciate you taking the time to fill it out.  Thanks, Kendra Aguilar, CNM, [facility]  The [facility] HAS MOVED!!! It is now [facility] ([address] [place], [place] [address]) Entrance located off of [address] [facility] Free 24/7 valet parking   For itching:  . Avoid hot showers/baths, take cool/luke-warm showers/baths instead . Cool washcloths to itchy areas . Aquaphor or Eucerin creams to moisturize the skin . Hydrocortisone or Benadryl cream, or Benadryl by mouth for the itching . Oatmeal baths   PUPPP Rash  Pruritic urticarial papules and plaques of pregnancy (PUPPP) is a rash that develops during pregnancy, or sometimes shortly after giving birth. The rash consists of small red bumps that sometimes form large, scaly, red patches of skin (plaques). The rash goes away soon after childbirth, and it does not leave scars or harm you or your baby. PUPPP usually appears during the last few weeks of pregnancy. It usually does not return during later pregnancies. What are the causes? The cause of this condition is not known. However, it may be related to your skin stretching rapidly during the later stages of pregnancy. What increases the risk? You are more likely to develop this condition if:  You are pregnant for the first time.  You are pregnant with more than one baby. What are the signs or symptoms? The main symptom of this condition is a very itchy rash on the abdomen. The rash often looks red and raised, and sometimes small blisters form in the center of the rash patches. The rash may spread to the legs or arms. The skin around the rash may turn pale. How is this diagnosed? This condition is diagnosed based on a physical exam. Your health care provider will examine your skin and ask questions about your symptoms. In some cases, your health care  provider may take a sample of your skin (biopsy) for testing in order to rule out other causes of skin conditions. How is this treated? The goal of treatment is to stop the itching and keep the rash from spreading. Common treatment options include medicines that relieve or lessen itching (corticosteroids or antihistamines). These medicines may be:  Creams.  Ointments.  Pills to be taken by mouth (orally). Follow these instructions at home: Skin care  Apply any creams or ointments as directed by your health care provider.  Wear loose clothing.  Keep your skin clean and dry.  Do not scratch the rash.  Do not bathe in hot water. Take cool baths to soothe your skin. Try adding baking soda or oatmeal to the water to reduce itching.  Apply cool, wet cloths (compresses) to itchy areas as told by your health care provider. General instructions  Take over-the-counter and prescription medicines only as told by your health care provider.  Keep all follow-up visits as told by your health care provider. This is important. Contact a health care provider if:  Your itchiness does not go away after treatment.  Your rash continues to spread.  You are unable to sleep because of the irritation. Summary  Pruritic urticarial papules and plaques of pregnancy (PUPPP) is a rash that develops during pregnancy, or sometimes shortly after giving birth.  The rash goes away soon after giving birth. It does not leave scars or harm you or your baby.  You are more likely to develop  this condition if this is your first pregnancy or you are pregnant with more than one baby.  The goal of treatment is to stop the itching and keep the rash from spreading. This information is not intended to replace advice given to you by your health care provider. Make sure you discuss any questions you have with your health care provider. Document Released: 06/18/2009 Document Revised: 06/11/2016 Document Reviewed:  06/11/2016 Elsevier Interactive Patient Education  2019 Elsevier Inc.   Home Blood Pressure Monitoring for Patients   Your provider has recommended that you check your blood pressure (BP) at least once a week at home. If you do not have a blood pressure cuff at home, one will be provided for you. Contact your provider if you have not received your monitor within 1 week.   Helpful Tips for Accurate Home Blood Pressure Checks  . Don't smoke, exercise, or drink caffeine 30 minutes before checking your BP . Use the restroom before checking your BP (a full bladder can raise your pressure) . Relax in a comfortable upright chair . Feet on the ground . Left arm resting comfortably on a flat surface at the level of your heart . Legs uncrossed . Back supported . Sit quietly and don't talk . Place the cuff on your bare arm . Adjust snuggly, so that only two fingertips can fit between your skin and the top of the cuff . Check 2 readings separated by at least one minute . Keep a log of your BP readings . For a visual, please reference this diagram: http://ccnc.care/bpdiagram  Provider Name: [facility]     Phone: [phone]  Zone 1: ALL CLEAR  Continue to monitor your symptoms:  . BP reading is less than 140 (top number) or less than 90 (bottom number)  . No right upper stomach pain . No headaches or seeing spots . No feeling nauseated or throwing up . No swelling in face and hands  Zone 2: CAUTION Call your doctor's office for any of the following:  . BP reading is greater than 140 (top number) or greater than 90 (bottom number)  . Stomach pain under your ribs in the middle or right side . Headaches or seeing spots . Feeling nauseated or throwing up . Swelling in face and hands  Zone 3: EMERGENCY  Seek immediate medical care if you have any of the following:  . BP reading is greater than160 (top number) or greater than 110 (bottom number) . Severe headaches not improving  with Tylenol . Serious difficulty catching your breath . Any worsening symptoms from Zone 2     Call the office [phone]) or go to [facility] if:  You begin to have strong, frequent contractions  Your water breaks.  Sometimes it is a big gush of fluid, sometimes it is just a trickle that keeps getting your panties wet or running down your legs  You have vaginal bleeding.  It is normal to have a small amount of spotting if your cervix was checked.   You don't feel your baby moving like normal.  If you don't, get you something to eat and drink and lay down and focus on feeling your baby move.  You should feel at least 10 movements in 2 hours.  If you don't, you should call the office or go to [facility].    Preterm Labor and Birth Information  The normal length of a pregnancy is 39-41 weeks. Preterm labor is when labor starts  before 37 completed weeks of pregnancy. What are the risk factors for preterm labor? Preterm labor is more likely to occur in women who:  Have certain infections during pregnancy such as a bladder infection, sexually transmitted infection, or infection inside the uterus (chorioamnionitis).  Have a shorter-than-normal cervix.  Have gone into preterm labor before.  Have had surgery on their cervix.  Are younger than age 77 or older than age 86.  Are African American.  Are pregnant with twins or multiple babies (multiple gestation).  Take street drugs or smoke while pregnant.  Do not gain enough weight while pregnant.  Became pregnant shortly after having been pregnant. What are the symptoms of preterm labor? Symptoms of preterm labor include:  Cramps similar to those that can happen during a menstrual period. The cramps may happen with diarrhea.  Pain in the abdomen or lower back.  Regular uterine contractions that may feel like tightening of the abdomen.  A feeling of increased pressure in the pelvis.  Increased watery or bloody  mucus discharge from the vagina.  Water breaking (ruptured amniotic sac). Why is it important to recognize signs of preterm labor? It is important to recognize signs of preterm labor because babies who are born prematurely may not be fully developed. This can put them at an increased risk for:  Long-term (chronic) heart and lung problems.  Difficulty immediately after birth with regulating body systems, including blood sugar, body temperature, heart rate, and breathing rate.  Bleeding in the brain.  Cerebral palsy.  Learning difficulties.  Death. These risks are highest for babies who are born before 34 weeks of pregnancy. How is preterm labor treated? Treatment depends on the length of your pregnancy, your condition, and the health of your baby. It may involve:  Having a stitch (suture) placed in your cervix to prevent your cervix from opening too early (cerclage).  Taking or being given medicines, such as: ? Hormone medicines. These may be given early in pregnancy to help support the pregnancy. ? Medicine to stop contractions. ? Medicines to help mature the baby's lungs. These may be prescribed if the risk of delivery is high. ? Medicines to prevent your baby from developing cerebral palsy. If the labor happens before 34 weeks of pregnancy, you may need to stay in the hospital. What should I do if I think I am in preterm labor? If you think that you are going into preterm labor, call your health care provider right away. How can I prevent preterm labor in future pregnancies? To increase your chance of having a full-term pregnancy:  Do not use any tobacco products, such as cigarettes, chewing tobacco, and e-cigarettes. If you need help quitting, ask your health care provider.  Do not use street drugs or medicines that have not been prescribed to you during your pregnancy.  Talk with your health care provider before taking any herbal supplements, even if you have been taking  them regularly.  Make sure you gain a healthy amount of weight during your pregnancy.  Watch for infection. If you think that you might have an infection, get it checked right away.  Make sure to tell your health care provider if you have gone into preterm labor before. This information is not intended to replace advice given to you by your health care provider. Make sure you discuss any questions you have with your health care provider. Document Released: 06/14/2003 Document Revised: 09/04/2015 Document Reviewed: 08/15/2015 Elsevier Interactive Patient Education  2019 [employer].  Coronavirus (COVID-19) Are you at risk?  Are you at risk for the Coronavirus (COVID-19)?  To be considered HIGH RISK for Coronavirus (COVID-19), you have to meet the following criteria:  . Traveled to [place], [place], [place], [place] or [place]; or in the [place] to [place], [place], [place], or [place]; and have fever, cough, and shortness of breath within the last 2 weeks of travel OR . Been in close contact with a person diagnosed with COVID-19 within the last 2 weeks and have fever, cough, and shortness of breath . IF YOU DO NOT MEET THESE CRITERIA, YOU ARE CONSIDERED LOW RISK FOR COVID-19.  What to do if you are HIGH RISK for COVID-19?  Marland Kitchen. If you are having a medical emergency, call 911. . Seek medical care right away. Before you go to a doctor's office, urgent care or emergency department, call ahead and tell them about your recent travel, contact with someone diagnosed with COVID-19, and your symptoms. You should receive instructions from your physician's office regarding next steps of care.  . When you arrive at healthcare provider, tell the healthcare staff immediately you have returned from visiting [place], [place], [place], [place] or [place]; or traveled in the [place] to [place], [place], [place], or [place]; in the last two weeks or you have been in close contact with a  person diagnosed with COVID-19 in the last 2 weeks.   . Tell the health care staff about your symptoms: fever, cough and shortness of breath. . After you have been seen by a medical provider, you will be either: o Tested for (COVID-19) and discharged home on quarantine except to seek medical care if symptoms worsen, and asked to  - Stay home and avoid contact with others until you get your results (4-5 days)  - Avoid travel on public transportation if possible (such as bus, train, or airplane) or o Sent to the Emergency Department by EMS for evaluation, COVID-19 testing, and possible admission depending on your condition and test results.  What to do if you are LOW RISK for COVID-19?  Reduce your risk of any infection by using the same precautions used for avoiding the common cold or flu:  Marland Kitchen. Wash your hands often with soap and warm water for at least 20 seconds.  If soap and water are not readily available, use an alcohol-based hand sanitizer with at least 60% alcohol.  . If coughing or sneezing, cover your mouth and nose by coughing or sneezing into the elbow areas of your shirt or coat, into a tissue or into your sleeve (not your hands). . Avoid shaking hands with others and consider head nods or verbal greetings only. . Avoid touching your eyes, nose, or mouth with unwashed hands.  . Avoid close contact with people who are sick. . Avoid places or events with large numbers of people in one location, like concerts or sporting events. . Carefully consider travel plans you have or are making. . If you are planning any travel outside or inside the [place], visit the CDC's Travelers' Health webpage for the latest health notices. . If you have some symptoms but not all symptoms, continue to monitor at home and seek medical attention if your symptoms worsen. . If you are having a medical emergency, call 911.   ADDITIONAL HEALTHCARE OPTIONS FOR PATIENTS  [facility] / e-Visit:  [email or web address]         [facility]: [phone]  [facility]: [phone]                   [facility]: [phone]

## 2018-09-03 ENCOUNTER — Other Ambulatory Visit: Payer: Self-pay | Admitting: Women's Health

## 2018-09-03 MED ORDER — PREDNISONE 20 MG PO TABS: 40.0000 mg | ORAL_TABLET | Freq: Every day | ORAL | 0 refills | Status: DC

## 2018-09-03 MED FILL — predniSONE 20 MG TABS: 20 | 10 days supply | Qty: 20 | Fill #0

## 2018-09-06 DIAGNOSIS — Z029 Encounter for administrative examinations, unspecified: Secondary | ICD-10-CM

## 2018-09-13 ENCOUNTER — Encounter: Payer: Self-pay | Admitting: *Deleted

## 2018-09-14 ENCOUNTER — Encounter: Payer: Self-pay | Admitting: Obstetrics & Gynecology

## 2018-09-14 ENCOUNTER — Other Ambulatory Visit: Payer: Self-pay

## 2018-09-14 ENCOUNTER — Ambulatory Visit (INDEPENDENT_AMBULATORY_CARE_PROVIDER_SITE_OTHER): Payer: 59 | Admitting: Obstetrics & Gynecology

## 2018-09-14 VITALS — BP 136/90 | HR 101 | Wt 222.0 lb

## 2018-09-14 DIAGNOSIS — Z1389 Encounter for screening for other disorder: Secondary | ICD-10-CM

## 2018-09-14 DIAGNOSIS — Z3483 Encounter for supervision of other normal pregnancy, third trimester: Secondary | ICD-10-CM | POA: Diagnosis not present

## 2018-09-14 DIAGNOSIS — Z3A36 36 weeks gestation of pregnancy: Secondary | ICD-10-CM | POA: Diagnosis not present

## 2018-09-14 DIAGNOSIS — Z331 Pregnant state, incidental: Secondary | ICD-10-CM

## 2018-09-14 DIAGNOSIS — Z7689 Persons encountering health services in other specified circumstances: Secondary | ICD-10-CM | POA: Diagnosis not present

## 2018-09-14 LAB — POCT URINALYSIS DIPSTICK OB
Blood, UA: NEGATIVE
Glucose, UA: NEGATIVE
Ketones, UA: NEGATIVE
Leukocytes, UA: NEGATIVE
Nitrite, UA: NEGATIVE

## 2018-09-14 NOTE — Progress Notes (Signed)
   LOW-RISK PREGNANCY VISIT Patient name: Kendra Aguilar MRN [id number]  Date of birth: 1996/02/06 Chief Complaint:   Routine Prenatal Visit (gbs-gc-chl)  History of Present Illness:   Kendra Aguilar is a 23 y.o. G42P0010 female at [redacted]w[redacted]d with an Estimated Date of Delivery: 10/07/18 being seen today for ongoing management of a low-risk pregnancy.  Today she reports no complaints. Contractions: Irregular.  .  Movement: Present. denies leaking of fluid. Review of Systems:   Pertinent items are noted in HPI Denies abnormal vaginal discharge w/ itching/odor/irritation, headaches, visual changes, shortness of breath, chest pain, abdominal pain, severe nausea/vomiting, or problems with urination or bowel movements unless otherwise stated above. Pertinent History Reviewed:  Reviewed past medical,surgical, social, obstetrical and family history.  Reviewed problem list, medications and allergies. Physical Assessment:   Vitals:   09/14/18 1052  BP: 136/90  Pulse: (!) 101  Weight: 222 lb (100.7 kg)  Body mass index is 36.94 kg/m.        Physical Examination:   General appearance: Well appearing, and in no distress  Mental status: Alert, oriented to person, place, and time  Skin: Warm & dry  Cardiovascular: Normal heart rate noted  Respiratory: Normal respiratory effort, no distress  Abdomen: Soft, gravid, nontender  Pelvic: Cervical exam deferred         Extremities: Edema: None  Fetal Status:     Movement: Present    Results for orders placed or performed in visit on 09/14/18 (from the past 24 hour(s))  POC Urinalysis Dipstick OB   Collection Time: 09/14/18 11:03 AM  Result Value Ref Range   Color, UA     Clarity, UA     Glucose, UA Negative Negative   Bilirubin, UA     Ketones, UA neg    Spec Grav, UA     Blood, UA neg    pH, UA     POC,PROTEIN,UA Trace Negative, Trace, Small (1+), Moderate (2+), Large (3+), 4+   Urobilinogen, UA     Nitrite, UA neg    Leukocytes, UA  Negative Negative   Appearance     Odor      Assessment & Plan:  1) Low-risk pregnancy G2P0010 at [redacted]w[redacted]d with an Estimated Date of Delivery: 10/07/18   2) Borderline GHTN, close interval follow up, BP check only 3 days     Meds: No orders of the defined types were placed in this encounter.  Labs/procedures today:   Plan:  Continue routine obstetrical care   Reviewed: Term labor symptoms and general obstetric precautions including but not limited to vaginal bleeding, contractions, leaking of fluid and fetal movement were reviewed in detail with the patient.  All questions were answered  Follow-up: Return in about 3 days (around 09/17/2018) for BP check only.  Orders Placed This Encounter  Procedures  . Strep Gp B NAA  . GC/Chlamydia Probe Amp  . POC Urinalysis Dipstick OB   Mertie Clause Roan Miklos  09/14/2018 11:29 AM

## 2018-09-16 LAB — STREP GP B NAA: Strep Gp B NAA: POSITIVE — AB

## 2018-09-17 ENCOUNTER — Ambulatory Visit: Payer: 59 | Admitting: *Deleted

## 2018-09-17 ENCOUNTER — Other Ambulatory Visit: Payer: Self-pay

## 2018-09-17 VITALS — BP 126/84 | HR 80

## 2018-09-17 DIAGNOSIS — Z0130 Encounter for examination of blood pressure without abnormal findings: Secondary | ICD-10-CM

## 2018-09-17 NOTE — Progress Notes (Signed)
Discussed BP with Dr. Elonda Husky he recommends return in 1 week for OB visit.

## 2018-09-22 LAB — GC/CHLAMYDIA PROBE AMP
Chlamydia trachomatis, NAA: NEGATIVE
Neisseria Gonorrhoeae by PCR: NEGATIVE

## 2018-09-24 ENCOUNTER — Encounter: Payer: Self-pay | Admitting: Obstetrics & Gynecology

## 2018-09-24 ENCOUNTER — Other Ambulatory Visit: Payer: Self-pay

## 2018-09-24 ENCOUNTER — Ambulatory Visit (INDEPENDENT_AMBULATORY_CARE_PROVIDER_SITE_OTHER): Payer: 59 | Admitting: Obstetrics & Gynecology

## 2018-09-24 VITALS — BP 140/84 | HR 115 | Wt 225.0 lb

## 2018-09-24 DIAGNOSIS — O133 Gestational [pregnancy-induced] hypertension without significant proteinuria, third trimester: Secondary | ICD-10-CM

## 2018-09-24 DIAGNOSIS — Z331 Pregnant state, incidental: Secondary | ICD-10-CM

## 2018-09-24 DIAGNOSIS — Z3A38 38 weeks gestation of pregnancy: Secondary | ICD-10-CM

## 2018-09-24 DIAGNOSIS — Z1389 Encounter for screening for other disorder: Secondary | ICD-10-CM

## 2018-09-24 LAB — POCT URINALYSIS DIPSTICK OB
Blood, UA: NEGATIVE
Glucose, UA: NEGATIVE
Ketones, UA: NEGATIVE
Leukocytes, UA: NEGATIVE
Nitrite, UA: NEGATIVE
POC,PROTEIN,UA: NEGATIVE

## 2018-09-24 MED ORDER — SILVER SULFADIAZINE 1 % EX CREA: TOPICAL_CREAM | CUTANEOUS | 11 refills | Status: DC

## 2018-09-24 NOTE — Progress Notes (Signed)
   LOW-RISK PREGNANCY VISIT Patient name: Kendra Aguilar MRN [id number]  Date of birth: October 14, 1995 Chief Complaint:   Routine Prenatal Visit  History of Present Illness:   Kendra Aguilar is a 23 y.o. G42P0010 female at [redacted]w[redacted]d with an Estimated Date of Delivery: 10/07/18 being seen today for ongoing management of a low-risk pregnancy.  Today she reports no complaints. Contractions: Irregular. Vag. Bleeding: None.  Movement: Present. denies leaking of fluid. Review of Systems:   Pertinent items are noted in HPI Denies abnormal vaginal discharge w/ itching/odor/irritation, headaches, visual changes, shortness of breath, chest pain, abdominal pain, severe nausea/vomiting, or problems with urination or bowel movements unless otherwise stated above. Pertinent History Reviewed:  Reviewed past medical,surgical, social, obstetrical and family history.  Reviewed problem list, medications and allergies. Physical Assessment:   Vitals:   09/24/18 0956 09/24/18 0958  BP: (!) 152/84 140/84  Pulse: (!) 115   Weight: 225 lb (102.1 kg)   Body mass index is 37.44 kg/m.        Physical Examination:   General appearance: Well appearing, and in no distress  Mental status: Alert, oriented to person, place, and time  Skin: Warm & dry  Cardiovascular: Normal heart rate noted  Respiratory: Normal respiratory effort, no distress  Abdomen: Soft, gravid, nontender  Pelvic: Cervical exam deferred         Extremities: Edema: None  Fetal Status: Fetal Heart Rate (bpm): 144 Fundal Height: 38 cm Movement: Present    Results for orders placed or performed in visit on 09/24/18 (from the past 24 hour(s))  POC Urinalysis Dipstick OB   Collection Time: 09/24/18  9:59 AM  Result Value Ref Range   Color, UA     Clarity, UA     Glucose, UA Negative Negative   Bilirubin, UA     Ketones, UA neg    Spec Grav, UA     Blood, UA neg    pH, UA     POC,PROTEIN,UA Negative Negative, Trace, Small (1+), Moderate (2+),  Large (3+), 4+   Urobilinogen, UA     Nitrite, UA neg    Leukocytes, UA Negative Negative   Appearance     Odor      Assessment & Plan:  1) Low-risk pregnancy G2P0010 at [redacted]w[redacted]d with an Estimated Date of Delivery: 10/07/18   2) Borderline GHTN, short interval follow up visit, twice weekly runs 130s/80s at home Meds:  Meds ordered this encounter  Medications  . silver sulfADIAZINE (SILVADENE) 1 % cream    Sig: Use to areas 3 times daily    Dispense:  50 g    Refill:  11   Labs/procedures today:   Plan:  Continue routine obstetrical care   Reviewed: Term labor symptoms and general obstetric precautions including but not limited to vaginal bleeding, contractions, leaking of fluid and fetal movement were reviewed in detail with the patient.  All questions were answered  Follow-up: Return in about 4 days (around 09/28/2018) for BP check.  Orders Placed This Encounter  Procedures  . POC Urinalysis Dipstick OB   Florian Buff  09/24/2018 10:23 AM

## 2018-09-28 ENCOUNTER — Other Ambulatory Visit: Payer: Self-pay | Admitting: Women's Health

## 2018-09-28 ENCOUNTER — Other Ambulatory Visit: Payer: Self-pay

## 2018-09-28 ENCOUNTER — Encounter (HOSPITAL_COMMUNITY): Payer: Self-pay | Admitting: *Deleted

## 2018-09-28 ENCOUNTER — Ambulatory Visit: Payer: 59

## 2018-09-28 ENCOUNTER — Inpatient Hospital Stay (HOSPITAL_COMMUNITY)
Admission: RE | Admit: 2018-09-28 | Discharge: 2018-09-30 | DRG: 807 | Disposition: A | Discharge status: Home / Self Care | Payer: 59 | Attending: Obstetrics and Gynecology | Admitting: Obstetrics and Gynecology

## 2018-09-28 VITALS — BP 152/88 | HR 108 | Ht 64.0 in | Wt 228.0 lb

## 2018-09-28 DIAGNOSIS — Z1159 Encounter for screening for other viral diseases: Secondary | ICD-10-CM

## 2018-09-28 DIAGNOSIS — O99214 Obesity complicating childbirth: Secondary | ICD-10-CM | POA: Diagnosis present

## 2018-09-28 DIAGNOSIS — F418 Other specified anxiety disorders: Secondary | ICD-10-CM | POA: Diagnosis present

## 2018-09-28 DIAGNOSIS — O99344 Other mental disorders complicating childbirth: Secondary | ICD-10-CM | POA: Diagnosis not present

## 2018-09-28 DIAGNOSIS — F329 Major depressive disorder, single episode, unspecified: Secondary | ICD-10-CM | POA: Diagnosis present

## 2018-09-28 DIAGNOSIS — O134 Gestational [pregnancy-induced] hypertension without significant proteinuria, complicating childbirth: Principal | ICD-10-CM | POA: Diagnosis present

## 2018-09-28 DIAGNOSIS — O26893 Other specified pregnancy related conditions, third trimester: Secondary | ICD-10-CM | POA: Diagnosis present

## 2018-09-28 DIAGNOSIS — Z331 Pregnant state, incidental: Secondary | ICD-10-CM

## 2018-09-28 DIAGNOSIS — Z6791 Unspecified blood type, Rh negative: Secondary | ICD-10-CM

## 2018-09-28 DIAGNOSIS — O360130 Maternal care for anti-D [Rh] antibodies, third trimester, not applicable or unspecified: Secondary | ICD-10-CM | POA: Diagnosis not present

## 2018-09-28 DIAGNOSIS — Z0130 Encounter for examination of blood pressure without abnormal findings: Secondary | ICD-10-CM

## 2018-09-28 DIAGNOSIS — Z1389 Encounter for screening for other disorder: Secondary | ICD-10-CM

## 2018-09-28 DIAGNOSIS — O164 Unspecified maternal hypertension, complicating childbirth: Secondary | ICD-10-CM | POA: Diagnosis not present

## 2018-09-28 DIAGNOSIS — Z3A38 38 weeks gestation of pregnancy: Secondary | ICD-10-CM | POA: Diagnosis not present

## 2018-09-28 DIAGNOSIS — O99824 Streptococcus B carrier state complicating childbirth: Secondary | ICD-10-CM | POA: Diagnosis present

## 2018-09-28 DIAGNOSIS — O26899 Other specified pregnancy related conditions, unspecified trimester: Secondary | ICD-10-CM

## 2018-09-28 DIAGNOSIS — O139 Gestational [pregnancy-induced] hypertension without significant proteinuria, unspecified trimester: Secondary | ICD-10-CM | POA: Diagnosis present

## 2018-09-28 LAB — CBC
HCT: 36.1 % (ref 36.0–46.0)
Hemoglobin: 11.6 g/dL — ABNORMAL LOW (ref 12.0–15.0)
MCH: 25.3 pg — ABNORMAL LOW (ref 26.0–34.0)
MCHC: 32.1 g/dL (ref 30.0–36.0)
MCV: 78.6 fL — ABNORMAL LOW (ref 80.0–100.0)
Platelets: 304 10*3/uL (ref 150–400)
RBC: 4.59 MIL/uL (ref 3.87–5.11)
RDW: 13.7 % (ref 11.5–15.5)
WBC: 10.5 10*3/uL (ref 4.0–10.5)
nRBC: 0 % (ref 0.0–0.2)

## 2018-09-28 LAB — TYPE AND SCREEN
ABO/RH(D): A NEG
Antibody Screen: NEGATIVE

## 2018-09-28 LAB — POCT URINALYSIS DIPSTICK OB
Blood, UA: NEGATIVE
Glucose, UA: NEGATIVE
Ketones, UA: NEGATIVE
Leukocytes, UA: NEGATIVE
Nitrite, UA: NEGATIVE
POC,PROTEIN,UA: NEGATIVE

## 2018-09-28 LAB — COMPREHENSIVE METABOLIC PANEL
ALT: 11 U/L (ref 0–44)
AST: 15 U/L (ref 15–41)
Albumin: 2.7 g/dL — ABNORMAL LOW (ref 3.5–5.0)
Alkaline Phosphatase: 123 U/L (ref 38–126)
Anion gap: 10 (ref 5–15)
BUN: 6 mg/dL (ref 6–20)
CO2: 19 mmol/L — ABNORMAL LOW (ref 22–32)
Calcium: 9.1 mg/dL (ref 8.9–10.3)
Chloride: 107 mmol/L (ref 98–111)
Creatinine, Ser: 0.66 mg/dL (ref 0.44–1.00)
GFR calc Af Amer: >60 mL/min (ref >60)
GFR calc non Af Amer: >60 mL/min (ref >60)
Glucose, Bld: 71 mg/dL (ref 70–99)
Potassium: 3.9 mmol/L (ref 3.5–5.1)
Sodium: 136 mmol/L (ref 135–145)
Total Bilirubin: 0.3 mg/dL (ref 0.3–1.2)
Total Protein: 6.4 g/dL — ABNORMAL LOW (ref 6.5–8.1)

## 2018-09-28 LAB — SARS CORONAVIRUS 2 BY RT PCR (HOSPITAL ORDER, PERFORMED IN ~~LOC~~ HOSPITAL LAB): SARS Coronavirus 2: NEGATIVE

## 2018-09-28 LAB — PROTEIN / CREATININE RATIO, URINE
Creatinine, Urine: 42.59 mg/dL
Total Protein, Urine: <10 mg/dL

## 2018-09-28 LAB — ABO/RH: ABO/RH(D): A NEG

## 2018-09-28 MED ORDER — LACTATED RINGERS IV SOLN: 500.0000 mL | INTRAVENOUS | Status: DC | PRN

## 2018-09-28 MED ORDER — SODIUM CHLORIDE 0.9 % IV SOLN
5.0000 10*6.[IU] | Freq: Once | INTRAVENOUS | Status: AC
  Administered 2018-09-28: 5 10*6.[IU] via INTRAVENOUS
  Filled 2018-09-28: qty 5

## 2018-09-28 MED ORDER — SERTRALINE HCL 50 MG PO TABS
50.0000 mg | ORAL_TABLET | Freq: Every day | ORAL | Status: DC
  Filled 2018-09-28: qty 1

## 2018-09-28 MED ORDER — ACETAMINOPHEN 325 MG PO TABS: 650.0000 mg | ORAL_TABLET | ORAL | Status: DC | PRN

## 2018-09-28 MED ORDER — MISOPROSTOL 25 MCG QUARTER TABLET
25.0000 ug | ORAL_TABLET | ORAL | Status: DC | PRN
  Administered 2018-09-28 (×3): 25 ug via VAGINAL
  Filled 2018-09-28 (×3): qty 1

## 2018-09-28 MED ORDER — TERBUTALINE SULFATE 1 MG/ML IJ SOLN: 0.2500 mg | Freq: Once | INTRAMUSCULAR | Status: DC | PRN

## 2018-09-28 MED ORDER — LACTATED RINGERS IV SOLN
INTRAVENOUS | Status: DC
  Administered 2018-09-28 – 2018-09-29 (×3): via INTRAVENOUS

## 2018-09-28 MED ORDER — ONDANSETRON HCL 4 MG/2ML IJ SOLN: 4.0000 mg | Freq: Four times a day (QID) | INTRAMUSCULAR | Status: DC | PRN

## 2018-09-28 MED ORDER — OXYCODONE-ACETAMINOPHEN 5-325 MG PO TABS: 1.0000 | ORAL_TABLET | ORAL | Status: DC | PRN

## 2018-09-28 MED ORDER — OXYCODONE-ACETAMINOPHEN 5-325 MG PO TABS: 2.0000 | ORAL_TABLET | ORAL | Status: DC | PRN

## 2018-09-28 MED ORDER — SOD CITRATE-CITRIC ACID 500-334 MG/5ML PO SOLN: 30.0000 mL | ORAL | Status: DC | PRN

## 2018-09-28 MED ORDER — FENTANYL CITRATE (PF) 100 MCG/2ML IJ SOLN: 100.0000 ug | INTRAMUSCULAR | Status: DC | PRN

## 2018-09-28 MED ORDER — OXYTOCIN BOLUS FROM INFUSION: 500.0000 mL | Freq: Once | INTRAVENOUS | Status: DC

## 2018-09-28 MED ORDER — PENICILLIN G 3 MILLION UNITS IVPB - SIMPLE MED
3.0000 10*6.[IU] | INTRAVENOUS | Status: DC
  Administered 2018-09-28 – 2018-09-29 (×4): 3 10*6.[IU] via INTRAVENOUS
  Filled 2018-09-28 (×4): qty 100

## 2018-09-28 MED ORDER — LIDOCAINE HCL (PF) 1 % IJ SOLN: 30.0000 mL | INTRAMUSCULAR | Status: DC | PRN

## 2018-09-28 MED ORDER — OXYTOCIN 40 UNITS IN NORMAL SALINE INFUSION - SIMPLE MED: 2.5000 [IU]/h | INTRAVENOUS | Status: DC

## 2018-09-28 NOTE — Progress Notes (Signed)
Pt here for blood pressure check 152/88 pulse 108. Spoke kim booker about reading.was here also for schedule induction. Kim booker with patient. Pad CMA

## 2018-09-28 NOTE — MAU Note (Signed)
covid swab collected

## 2018-09-28 NOTE — Progress Notes (Signed)
Patient ID: Kendra Aguilar, female   DOB: 1995-07-13, 23 y.o.   MRN: [id number]  Feels well  BP 126/75, 136/68 FHR 120s, +accels, no decels, Cat 1 Ctx irreg/mild 3-6 mins Cx post 1/thick/vtx -2  IUP@38 .5wks gHTN Cx unfavorable  -Cervical foley inserted without difficulty and a second dose of cytotec placed -Plan for Pit when the foley comes out  Myrtis Ser CNM 09/28/2018 4:31 PM

## 2018-09-28 NOTE — H&P (Signed)
Kendra Aguilar is a 23 y.o. female G2P0010 @ 38.5wks by 8wk scan presenting for IOL due to gHTN dx at the office today. Denies H/A, RUQ pain or visual disturbances. No N/V or fever. Her preg has been followed by the [facility] service since 10wks and has been remarkable for:  # new onset gHTN # Rh neg # depression (Zoloft) # GBS pos  OB History    Gravida  2   Para      Term      Preterm      AB  1   Living        SAB      TAB  1   Ectopic      Multiple      Live Births             Past Medical History:  Diagnosis Date  . Anxiety   . CHICKENPOX, HX OF 08/08/2008   Qualifier: Diagnosis of  By: Jonna Munro MD, Roderic Scarce    . Depression    Past Surgical History:  Procedure Laterality Date  . WISDOM TOOTH EXTRACTION     Family History: family history includes Diabetes in her brother and maternal grandmother; Heart attack in her maternal grandmother; Hyperlipidemia in her mother; Other in her brother. Social History:  reports that she has never smoked. She has never used smokeless tobacco. She reports that she does not drink alcohol or use drugs.     Maternal Diabetes: No Genetic Screening: Normal Maternal Ultrasounds/Referrals: Normal Fetal Ultrasounds or other Referrals:  None Maternal Substance Abuse:  No Significant Maternal Medications:  Meds include: Zoloft Significant Maternal Lab Results:  Group B Strep positive Other Comments:  None  ROS History Dilation: 1 Effacement (%): 60 Station: Ballotable Exam by:: Cecelia Byars, RN Blood pressure 120/78, pulse 70, temperature 98.8 F (37.1 C), temperature source Oral, resp. rate 20, height 5\' 4"  (1.626 m), weight 103.4 kg, last menstrual period 12/25/2017. Exam Physical Exam  Constitutional: She is oriented to person, place, and time. She appears well-developed.  HENT:  Head: Normocephalic.  Neck: Normal range of motion.  GI:  EFM 120s, +accels, no decels, Cat 1 Irreg mild ctx   Musculoskeletal: Normal range of motion.  Neurological: She is alert and oriented to person, place, and time.  Skin: Skin is warm and dry.  Psychiatric: She has a normal mood and affect. Her behavior is normal. Thought content normal.    Prenatal labs: ABO, Rh: --/--/A NEG Performed at [facility], [place] [address] [place], [place] [address]  [phone]/23 1122) Antibody: NEG (06/23 1115) Rubella: 3.40 (12/11 1614) RPR: Non Reactive (03/30 0840)  HBsAg: Negative (12/11 1614)  HIV: Non Reactive (03/30 0840)  GBS: Positive (06/09 1300)   Assessment/Plan: IUP@38 .5wks New onset gHTN GBS pos Cx unfavorable  Admit to Labor and Delivery Collect CMET/CBC/urine p/c ratio with admit labs Plan cervical ripening with cytotec/cervical foley followed by Pit/AROM as appropriate PCN for GBS ppx when in active labor or ROM Plan Rhogam eval pp Continue Zoloft   Myrtis Ser CNM 09/28/2018, 1:57 PM

## 2018-09-28 NOTE — Progress Notes (Signed)
Labor Progress Note  Subjective: Introduced self to patient. Pt doing well. Reports mild cramping, but no other complaints. Pt mother at bedside.   Objective: BP 140/83   Pulse 68   Temp 98.7 F (37.1 C) (Oral)   Resp 16   Ht 5\' 4"  (1.626 m)   Wt 103.4 kg   LMP 12/25/2017 (Exact Date)   BMI 39.14 kg/m  Gen: alert, oriented Dilation: 3 Effacement (%): 50, 60 Station: -3 Presentation: Vertex Exam by:: s seagraves rn  Assessment and Plan: 23 y.o. G2P0010 [redacted]w[redacted]d admitted for IOL for new onset gHTN.  Labor: -- Pt received 3rd dose Cytotec at 2130. Will recheck in 4 hours to determine need for additional dose or if Pitocin can be started.  -- Anticipate NSVD -- GBS positive; given PCN -- Pain control: none at this time but planning epidural -- PPH Risk: high  Fetal Well-Being:  -- Category 1 tracing    Maryagnes Amos, SNM 10:06 PM

## 2018-09-28 NOTE — Progress Notes (Signed)
Pt here for bp check w/ nurse, [redacted]w[redacted]d, bp 152/88, asymptomatic, GHTN dx, direct admit to LD for IOL, notified Colie, RN and Derrill Memo, CNM.  Roma Schanz, CNM, [facility] 09/28/2018 9:29 AM

## 2018-09-29 ENCOUNTER — Inpatient Hospital Stay (HOSPITAL_COMMUNITY): Payer: 59 | Admitting: Anesthesiology

## 2018-09-29 ENCOUNTER — Encounter (HOSPITAL_COMMUNITY): Payer: Self-pay | Admitting: *Deleted

## 2018-09-29 DIAGNOSIS — O134 Gestational [pregnancy-induced] hypertension without significant proteinuria, complicating childbirth: Secondary | ICD-10-CM

## 2018-09-29 DIAGNOSIS — Z3A38 38 weeks gestation of pregnancy: Secondary | ICD-10-CM

## 2018-09-29 DIAGNOSIS — O99824 Streptococcus B carrier state complicating childbirth: Secondary | ICD-10-CM

## 2018-09-29 DIAGNOSIS — O99344 Other mental disorders complicating childbirth: Secondary | ICD-10-CM

## 2018-09-29 DIAGNOSIS — F418 Other specified anxiety disorders: Secondary | ICD-10-CM

## 2018-09-29 DIAGNOSIS — O360130 Maternal care for anti-D [Rh] antibodies, third trimester, not applicable or unspecified: Secondary | ICD-10-CM

## 2018-09-29 LAB — RPR: RPR Ser Ql: NONREACTIVE

## 2018-09-29 MED ORDER — ONDANSETRON HCL 4 MG/2ML IJ SOLN: 4.0000 mg | INTRAMUSCULAR | Status: DC | PRN

## 2018-09-29 MED ORDER — PRENATAL MULTIVITAMIN CH
1.0000 | ORAL_TABLET | Freq: Every day | ORAL | Status: DC
  Administered 2018-09-29 – 2018-09-30 (×2): 1 via ORAL
  Filled 2018-09-29 (×2): qty 1

## 2018-09-29 MED ORDER — ZOLPIDEM TARTRATE 5 MG PO TABS: 5.0000 mg | ORAL_TABLET | Freq: Every evening | ORAL | Status: DC | PRN

## 2018-09-29 MED ORDER — OXYTOCIN 40 UNITS IN NORMAL SALINE INFUSION - SIMPLE MED
1.0000 m[IU]/min | INTRAVENOUS | Status: DC
  Administered 2018-09-29: 2 m[IU]/min via INTRAVENOUS
  Filled 2018-09-29: qty 1000

## 2018-09-29 MED ORDER — DIPHENHYDRAMINE HCL 50 MG/ML IJ SOLN: 12.5000 mg | INTRAMUSCULAR | Status: DC | PRN

## 2018-09-29 MED ORDER — WITCH HAZEL-GLYCERIN EX PADS: 1.0000 "application " | MEDICATED_PAD | CUTANEOUS | Status: DC | PRN

## 2018-09-29 MED ORDER — LACTATED RINGERS IV SOLN
500.0000 mL | Freq: Once | INTRAVENOUS | Status: AC
  Administered 2018-09-29: 500 mL via INTRAVENOUS

## 2018-09-29 MED ORDER — SODIUM CHLORIDE (PF) 0.9 % IJ SOLN
INTRAMUSCULAR | Status: DC | PRN
  Administered 2018-09-29: 12 mL/h via EPIDURAL

## 2018-09-29 MED ORDER — FENTANYL-BUPIVACAINE-NACL 0.5-0.125-0.9 MG/250ML-% EP SOLN
12.0000 mL/h | EPIDURAL | Status: DC | PRN
  Filled 2018-09-29: qty 250

## 2018-09-29 MED ORDER — SENNOSIDES-DOCUSATE SODIUM 8.6-50 MG PO TABS
2.0000 | ORAL_TABLET | ORAL | Status: DC
  Administered 2018-09-30: 2 via ORAL
  Filled 2018-09-29: qty 2

## 2018-09-29 MED ORDER — OXYCODONE HCL 5 MG PO TABS: 10.0000 mg | ORAL_TABLET | ORAL | Status: DC | PRN

## 2018-09-29 MED ORDER — DIPHENHYDRAMINE HCL 25 MG PO CAPS: 25.0000 mg | ORAL_CAPSULE | Freq: Four times a day (QID) | ORAL | Status: DC | PRN

## 2018-09-29 MED ORDER — EPHEDRINE 5 MG/ML INJ: 10.0000 mg | INTRAVENOUS | Status: DC | PRN

## 2018-09-29 MED ORDER — PHENYLEPHRINE 40 MCG/ML (10ML) SYRINGE FOR IV PUSH (FOR BLOOD PRESSURE SUPPORT)
80.0000 ug | PREFILLED_SYRINGE | INTRAVENOUS | Status: DC | PRN
  Filled 2018-09-29: qty 10

## 2018-09-29 MED ORDER — PHENYLEPHRINE 40 MCG/ML (10ML) SYRINGE FOR IV PUSH (FOR BLOOD PRESSURE SUPPORT): 80.0000 ug | PREFILLED_SYRINGE | INTRAVENOUS | Status: DC | PRN

## 2018-09-29 MED ORDER — DIBUCAINE (PERIANAL) 1 % EX OINT: 1.0000 "application " | TOPICAL_OINTMENT | CUTANEOUS | Status: DC | PRN

## 2018-09-29 MED ORDER — ACETAMINOPHEN 325 MG PO TABS: 650.0000 mg | ORAL_TABLET | ORAL | Status: DC | PRN

## 2018-09-29 MED ORDER — TETANUS-DIPHTH-ACELL PERTUSSIS 5-2.5-18.5 LF-MCG/0.5 IM SUSP: 0.5000 mL | Freq: Once | INTRAMUSCULAR | Status: DC

## 2018-09-29 MED ORDER — ONDANSETRON HCL 4 MG PO TABS: 4.0000 mg | ORAL_TABLET | ORAL | Status: DC | PRN

## 2018-09-29 MED ORDER — SIMETHICONE 80 MG PO CHEW: 80.0000 mg | CHEWABLE_TABLET | ORAL | Status: DC | PRN

## 2018-09-29 MED ORDER — RHO D IMMUNE GLOBULIN 1500 UNIT/2ML IJ SOSY
300.0000 ug | PREFILLED_SYRINGE | Freq: Once | INTRAMUSCULAR | Status: AC
  Administered 2018-09-29: 300 ug via INTRAVENOUS
  Filled 2018-09-29: qty 2

## 2018-09-29 MED ORDER — SERTRALINE HCL 50 MG PO TABS
50.0000 mg | ORAL_TABLET | Freq: Every day | ORAL | Status: DC
  Administered 2018-09-29 – 2018-09-30 (×2): 50 mg via ORAL
  Filled 2018-09-29 (×2): qty 1

## 2018-09-29 MED ORDER — BENZOCAINE-MENTHOL 20-0.5 % EX AERO
1.0000 "application " | INHALATION_SPRAY | CUTANEOUS | Status: DC | PRN
  Administered 2018-09-29: 1 via TOPICAL
  Filled 2018-09-29: qty 56

## 2018-09-29 MED ORDER — OXYCODONE HCL 5 MG PO TABS: 5.0000 mg | ORAL_TABLET | ORAL | Status: DC | PRN

## 2018-09-29 MED ORDER — TERBUTALINE SULFATE 1 MG/ML IJ SOLN
0.2500 mg | Freq: Once | INTRAMUSCULAR | Status: AC | PRN
  Administered 2018-09-29: 0.25 mg via SUBCUTANEOUS
  Filled 2018-09-29: qty 1

## 2018-09-29 MED ORDER — COCONUT OIL OIL: 1.0000 "application " | TOPICAL_OIL | Status: DC | PRN

## 2018-09-29 MED ORDER — LIDOCAINE HCL (PF) 1 % IJ SOLN
INTRAMUSCULAR | Status: DC | PRN
  Administered 2018-09-29 (×2): 5 mL via EPIDURAL

## 2018-09-29 MED ORDER — IBUPROFEN 600 MG PO TABS
600.0000 mg | ORAL_TABLET | Freq: Four times a day (QID) | ORAL | Status: DC
  Administered 2018-09-29 – 2018-09-30 (×5): 600 mg via ORAL
  Filled 2018-09-29 (×5): qty 1

## 2018-09-29 NOTE — Progress Notes (Signed)
MOB was referred for history of depression/anxiety. * Referral screened out by Clinical Social Worker because none of the following criteria appear to apply: ~ History of anxiety/depression during this pregnancy, or of post-partum depression following prior delivery. ~ Diagnosis of anxiety and/or depression within last 3 years OR * MOB's symptoms currently being treated with medication and/or therapy. Per MOB's records, MOB on Zoloft for anxiety/depression.    Please contact the Clinical Social Worker if needs arise, by MOB request, or if MOB scores greater than 9/yes to question 10 on Edinburgh Postpartum Depression Screen.       Cybele Maule S. Alfons Sulkowski, MSW, LCSW-A [facility] [phone]  

## 2018-09-29 NOTE — Anesthesia Postprocedure Evaluation (Signed)
Anesthesia Post Note  Patient: ADILEE LEMME  Procedure(s) Performed: AN AD [place]     Patient location during evaluation: Mother Baby Anesthesia Type: Epidural Level of consciousness: awake and alert Pain management: pain level controlled Vital Signs Assessment: post-procedure vital signs reviewed and stable Respiratory status: spontaneous breathing, nonlabored ventilation and respiratory function stable Cardiovascular status: stable Postop Assessment: no headache, no backache, epidural receding, no apparent nausea or vomiting, patient able to bend at knees, adequate PO intake and able to ambulate Anesthetic complications: no    Last Vitals:  Vitals:   09/29/18 1115 09/29/18 1215  BP: 133/73 132/82  Pulse: 76 84  Resp: 18 17  Temp: 37 C 37 C  SpO2:      Last Pain:  Vitals:   09/29/18 1215  TempSrc: Oral  PainSc: 0-No pain   Pain Goal:                   [employer]

## 2018-09-29 NOTE — Progress Notes (Addendum)
Labor Progress Note  Subjective: Checking in on patient. Pt doing well. Reports water broke at 0140 and has mild cramping. No other complaints  Objective: BP 129/67   Pulse 78   Temp 98.7 F (37.1 C) (Oral)   Resp 16   Ht 5\' 4"  (1.626 m)   Wt 103.4 kg   LMP 12/25/2017 (Exact Date)   BMI 39.14 kg/m  Gen: alert, oriented Dilation: 4 Effacement (%): 70 Station: -2 Presentation: Vertex Exam by:: Ainslie Mazurek  Assessment and Plan: 23 y.o. G2P0010 [redacted]w[redacted]d admitted for IOL for new onset gHTN.  Labor:  -- S/p foley bulb and cytotec -- Patient contracting q1.5-3 mins. Will monitor as cervix has made some change from previous exam and patient uncomfortable -- Will start Pitocin as needed if contractions space out or are inadequate -- Continue PCN for GBS -- Anticipate NSVD -- Pain control: not requesting anything at this time but planning epidural   Fetal Well-Being:  -- Category 1   Maryagnes Amos, SNM  2:08 AM

## 2018-09-29 NOTE — Progress Notes (Signed)
Labor Progress Note  Subjective: Checking in on patient. Pt resting comfortably with epidural. Has no complaints.  Objective: BP 123/80   Pulse 89   Temp 98.5 F (36.9 C) (Oral)   Resp 14   Ht 5\' 4"  (1.626 m)   Wt 103.4 kg   LMP 12/25/2017 (Exact Date)   SpO2 100%   BMI 39.14 kg/m  Gen: alert, oriented Dilation: 4.5 Effacement (%): 90 Station: 0 Presentation: Vertex Exam by:: D.Breannah Kratt  Assessment and Plan: 23 y.o. G2P0010 [redacted]w[redacted]d admitted for IOL for gHTN.  Labor:  -- Contracting q2-6 mins -- Will start Pitocin 2/2 -- Anticipate NSVD -- PCN for +GBS -- Pain control: epidural  Fetal Well-Being:  -- Category 1 tracing; FHR 135, moderate variability, +accels, early decels  Kendra Aguilar, SNM 6:30 AM

## 2018-09-29 NOTE — Discharge Summary (Signed)
Postpartum Discharge Summary     Patient Name: Kendra DAQUILA DOB: 10/30/1995 MRN: [id number]  Date of admission: 09/28/2018 Delivering Provider: Fatima Blank A   Date of discharge: 09/30/2018  Admitting diagnosis: Induction  Intrauterine pregnancy: [redacted]w[redacted]d     Secondary diagnosis:  Active Problems:   Depression with anxiety   Rh negative state in antepartum period   Gestational hypertension   NSVD (normal spontaneous vaginal delivery)   Second degree perineal laceration during delivery, delivered  Additional problems: none     Discharge diagnosis: Term Pregnancy Delivered                                                                                                Post partum procedures:none  Augmentation: Pitocin, Cytotec and Foley Balloon  Complications: None  Hospital course:  Induction of Labor With Vaginal Delivery   23 y.o. yo G2P0010 at [redacted]w[redacted]d was admitted to the hospital 09/28/2018 for induction of labor.  Indication for induction: Gestational hypertension.  Patient had an uncomplicated labor course as follows: Membrane Rupture Time/Date: 1:40 AM ,09/29/2018   Intrapartum Procedures: Episiotomy: None [1]                                         Lacerations:  2nd degree [3]  Patient had delivery of a Viable infant.  Information for the patient's newborn:  Jennessy, Sandridge [[id number]]  Delivery Method: Vaginal, Spontaneous(Filed from Delivery Summary)    09/29/2018  Details of delivery can be found in separate delivery note.  Patient had a routine postpartum course, BPs remained stable. Patient is discharged home 09/30/18.  Magnesium Sulfate recieved: No BMZ received: No  Physical exam  Vitals:   09/29/18 2020 09/30/18 0029 09/30/18 0429 09/30/18 0837  BP: 132/88 (!) 137/95 129/81 131/89  Pulse: 80 73 74 71  Resp: 18 20 18 18   Temp: 98 F (36.7 C) 99 F (37.2 C) 98 F (36.7 C) 98 F (36.7 C)  TempSrc: Oral Oral Oral Oral  SpO2: 100%  100%  100%  Weight:      Height:       General: alert, cooperative and no distress Lochia: appropriate Uterine Fundus: firm Incision: Healing well with no significant drainage, No significant erythema DVT Evaluation: No evidence of DVT seen on physical exam. Negative Homan's sign. No cords or calf tenderness. No significant calf/ankle edema. Labs: Lab Results  Component Value Date   WBC 10.5 09/28/2018   HGB 11.6 (L) 09/28/2018   HCT 36.1 09/28/2018   MCV 78.6 (L) 09/28/2018   PLT 304 09/28/2018   CMP Latest Ref Rng & Units 09/28/2018  Glucose 70 - 99 mg/dL 71  BUN 6 - 20 mg/dL 6  Creatinine 0.44 - 1.00 mg/dL 0.66  Sodium 135 - 145 mmol/L 136  Potassium 3.5 - 5.1 mmol/L 3.9  Chloride 98 - 111 mmol/L 107  CO2 22 - 32 mmol/L 19(L)  Calcium 8.9 - 10.3 mg/dL 9.1  Total Protein 6.5 - 8.1 g/dL 6.4(L)  Total Bilirubin 0.3 - 1.2 mg/dL 0.3  Alkaline Phos 38 - 126 U/L 123  AST 15 - 41 U/L 15  ALT 0 - 44 U/L 11    Discharge instruction: per After Visit Summary and "Baby and Me Booklet".  After visit meds:  Allergies as of 09/30/2018      Reactions   Latex    rash      Medication List    TAKE these medications   ibuprofen 600 MG tablet Commonly known as: ADVIL Take 1 tablet (600 mg total) by mouth every 6 (six) hours.   multivitamin-prenatal 27-0.8 MG Tabs tablet Take 1 tablet by mouth daily at 12 noon.   sertraline 100 MG tablet Commonly known as: ZOLOFT Take 1 tablet (100 mg total) by mouth daily. What changed: how much to take   silver sulfADIAZINE 1 % cream Commonly known as: Silvadene Use to areas 3 times daily       Diet: routine diet  Activity: Advance as tolerated. Pelvic rest for 6 weeks.   Outpatient follow up:1 week for BP check Follow up Appt: Future Appointments  Date Time Provider Department Center  10/06/2018 10:15 AM [facility]  11/04/2018 11:15 AM Cresenzo-Dishmon, Scarlette CalicoFrances, CNM [facility]   Follow up  Visit: Follow-up Information    [facility]. Schedule an appointment as soon as possible for a visit in 1 week(s).   Specialty: Obstetrics and Gynecology Why: blood pressure check Contact information: [address] [place] [phone]           [facility]:  Please schedule this patient for PP visit in: 4 weeks  Low risk pregnancy complicated by: HTN at term Delivery mode: SVD  Anticipated Birth Control: IUD  PP Procedures needed: BP check  Schedule Integrated BH visit: no  Provider: Any provider     Newborn Data: Live born female  Birth Weight:  7'13 APGAR: 9, 9  Newborn Delivery   Birth date/time: 09/29/2018 09:39:00 Delivery type: Vaginal, Spontaneous      Baby Feeding: Breast Disposition:home with mother   09/30/2018 Donette LarryMelanie Geroldine Esquivias, CNM

## 2018-09-29 NOTE — Anesthesia Procedure Notes (Signed)
Epidural Patient location during procedure: OB Start time: 09/29/2018 3:17 AM End time: 09/29/2018 3:32 AM  Staffing Anesthesiologist: Duane Boston, MD Performed: anesthesiologist   Preanesthetic Checklist Completed: patient identified, site marked, pre-op evaluation, timeout performed, IV checked, risks and benefits discussed and monitors and equipment checked  Epidural Patient position: sitting Prep: DuraPrep Patient monitoring: heart rate, cardiac monitor, continuous pulse ox and blood pressure Approach: midline Location: L2-L3 Injection technique: LOR saline  Needle:  Needle type: Tuohy  Needle gauge: 17 G Needle length: 9 cm Needle insertion depth: 7 cm Catheter size: 20 Guage Catheter at skin depth: 12 cm Test dose: negative and Other  Assessment Events: blood not aspirated, injection not painful, no injection resistance and negative IV test  Additional Notes Informed consent obtained prior to proceeding including risk of failure, 1% risk of PDPH, risk of minor discomfort and bruising.  Discussed rare but serious complications including epidural abscess, permanent nerve injury, epidural hematoma.  Discussed alternatives to epidural analgesia and patient desires to proceed.  Timeout performed pre-procedure verifying patient name, procedure, and platelet count.  Patient tolerated procedure well.

## 2018-09-29 NOTE — Anesthesia Preprocedure Evaluation (Signed)
Anesthesia Evaluation  Patient identified by MRN, date of birth, ID band Patient awake    Reviewed: Allergy & Precautions, NPO status , Patient's Chart, lab work & pertinent test results  Airway Mallampati: II  TM Distance: >3 FB Neck ROM: Full    Dental no notable dental hx. (+) Dental Advisory Given   Pulmonary neg pulmonary ROS,    Pulmonary exam normal        Cardiovascular hypertension, Normal cardiovascular exam     Neuro/Psych negative neurological ROS  negative psych ROS   GI/Hepatic negative GI ROS, Neg liver ROS,   Endo/Other  Morbid obesity  Renal/GU negative Renal ROS  negative genitourinary   Musculoskeletal negative musculoskeletal ROS (+)   Abdominal   Peds negative pediatric ROS (+)  Hematology negative hematology ROS (+)   Anesthesia Other Findings   Reproductive/Obstetrics (+) Pregnancy                             Anesthesia Physical Anesthesia Plan  ASA: III  Anesthesia Plan: Epidural   Post-op Pain Management:    Induction:   PONV Risk Score and Plan:   Airway Management Planned: Natural Airway  Additional Equipment:   Intra-op Plan:   Post-operative Plan:   Informed Consent: I have reviewed the patients History and Physical, chart, labs and discussed the procedure including the risks, benefits and alternatives for the proposed anesthesia with the patient or authorized representative who has indicated his/her understanding and acceptance.     Dental advisory given  Plan Discussed with: Anesthesiologist  Anesthesia Plan Comments:         Anesthesia Quick Evaluation

## 2018-09-29 NOTE — Progress Notes (Signed)
Kendra Aguilar is a 23 y.o. G2P0010 at [redacted]w[redacted]d by 8 week ultrasound admitted for induction of labor due to [facility].  Subjective: Pt feeling urge to push. Family member in room for support.  Objective: BP 139/71   Pulse 83   Temp (!) 97.5 F (36.4 C) (Oral)   Resp 16   Ht 5\' 4"  (1.626 m)   Wt 104 kg   LMP 12/25/2017 (Exact Date)   SpO2 100%   BMI 39.36 kg/m  No intake/output data recorded. No intake/output data recorded.  FHT:  FHR: 135 bpm, variability: moderate,  accelerations:  Present,  decelerations:  Absent UC:   regular, every 2 minutes SVE:   10/100/2  Labs: Lab Results  Component Value Date   WBC 10.5 09/28/2018   HGB 11.6 (L) 09/28/2018   HCT 36.1 09/28/2018   MCV 78.6 (L) 09/28/2018   PLT 304 09/28/2018    Assessment / Plan: Induction of labor due to gestational hypertension,  progressing well on pitocin  Labor: RN to begin pushing with pt Preeclampsia:  labs stable Fetal Wellbeing:  Category I Pain Control:  Epidural I/D:  GBS positive on PCN Anticipated MOD:  NSVD  Fatima Blank 09/29/2018, 9:29 AM

## 2018-09-30 LAB — RH IG WORKUP (INCLUDES ABO/RH)
ABO/RH(D): A NEG
Fetal Screen: NEGATIVE
Gestational Age(Wks): 38.6
Unit division: 0

## 2018-09-30 MED ORDER — IBUPROFEN 600 MG PO TABS: 600.0000 mg | ORAL_TABLET | Freq: Four times a day (QID) | ORAL | 0 refills | Status: DC

## 2018-09-30 MED FILL — IBUPROFEN 600 MG TABLET: 600 | 8 days supply | Qty: 30 | Fill #0

## 2018-09-30 NOTE — Discharge Instructions (Signed)
Vaginal Delivery, Care After °Refer to this sheet in the next few weeks. These instructions provide you with information about caring for yourself after vaginal delivery. Your health care provider may also give you more specific instructions. Your treatment has been planned according to current medical practices, but problems sometimes occur. Call your health care provider if you have any problems or questions. °What can I expect after the procedure? °After vaginal delivery, it is common to have: °· Some bleeding from your vagina. °· Soreness in your abdomen, your vagina, and the area of skin between your vaginal opening and your anus (perineum). °· Pelvic cramps. °· Fatigue. °Follow these instructions at home: °Medicines °· Take over-the-counter and prescription medicines only as told by your health care provider. °· If you were prescribed an antibiotic medicine, take it as told by your health care provider. Do not stop taking the antibiotic until it is finished. °Driving ° °· Do not drive or operate heavy machinery while taking prescription pain medicine. °· Do not drive for 24 hours if you received a sedative. °Lifestyle °· Do not drink alcohol. This is especially important if you are breastfeeding or taking medicine to relieve pain. °· Do not use tobacco products, including cigarettes, chewing tobacco, or e-cigarettes. If you need help quitting, ask your health care provider. °Eating and drinking °· Drink at least 8 eight-ounce glasses of water every day unless you are told not to by your health care provider. If you choose to breastfeed your baby, you may need to drink more water than this. °· Eat high-fiber foods every day. These foods may help prevent or relieve constipation. High-fiber foods include: °? Whole grain cereals and breads. °? Brown rice. °? Beans. °? Fresh fruits and vegetables. °Activity °· Return to your normal activities as told by your health care provider. Ask your health care provider what  activities are safe for you. °· Rest as much as possible. Try to rest or take a nap when your baby is sleeping. °· Do not lift anything that is heavier than your baby or 10 lb (4.5 kg) until your health care provider says that it is safe. °· Talk with your health care provider about when you can engage in sexual activity. This may depend on your: °? Risk of infection. °? Rate of healing. °? Comfort and desire to engage in sexual activity. °Vaginal Care °· If you have an episiotomy or a vaginal tear, check the area every day for signs of infection. Check for: °? More redness, swelling, or pain. °? More fluid or blood. °? Warmth. °? Pus or a bad smell. °· Do not use tampons or douches until your health care provider says this is safe. °· Watch for any blood clots that may pass from your vagina. These may look like clumps of dark red, brown, or black discharge. °General instructions °· Keep your perineum clean and dry as told by your health care provider. °· Wear loose, comfortable clothing. °· Wipe from front to back when you use the toilet. °· Ask your health care provider if you can shower or take a bath. If you had an episiotomy or a perineal tear during labor and delivery, your health care provider may tell you not to take baths for a certain length of time. °· Wear a bra that supports your breasts and fits you well. °· If possible, have someone help you with household activities and help care for your baby for at least a few days after you   leave the hospital. °· Keep all follow-up visits for you and your baby as told by your health care provider. This is important. °Contact a health care provider if: °· You have: °? Vaginal discharge that has a bad smell. °? Difficulty urinating. °? Pain when urinating. °? A sudden increase or decrease in the frequency of your bowel movements. °? More redness, swelling, or pain around your episiotomy or vaginal tear. °? More fluid or blood coming from your episiotomy or vaginal  tear. °? Pus or a bad smell coming from your episiotomy or vaginal tear. °? A fever. °? A rash. °? Little or no interest in activities you used to enjoy. °? Questions about caring for yourself or your baby. °· Your episiotomy or vaginal tear feels warm to the touch. °· Your episiotomy or vaginal tear is separating or does not appear to be healing. °· Your breasts are painful, hard, or turn red. °· You feel unusually sad or worried. °· You feel nauseous or you vomit. °· You pass large blood clots from your vagina. If you pass a blood clot from your vagina, save it to show to your health care provider. Do not flush blood clots down the toilet without having your health care provider look at them. °· You urinate more than usual. °· You are dizzy or light-headed. °· You have not breastfed at all and you have not had a menstrual period for 12 weeks after delivery. °· You have stopped breastfeeding and you have not had a menstrual period for 12 weeks after you stopped breastfeeding. °Get help right away if: °· You have: °? Pain that does not go away or does not get better with medicine. °? Chest pain. °? Difficulty breathing. °? Blurred vision or spots in your vision. °? Thoughts about hurting yourself or your baby. °· You develop pain in your abdomen or in one of your legs. °· You develop a severe headache. °· You faint. °· You bleed from your vagina so much that you fill two sanitary pads in one hour. °This information is not intended to replace advice given to you by your health care provider. Make sure you discuss any questions you have with your health care provider. °Document Released: 03/21/2000 Document Revised: 09/05/2015 Document Reviewed: 04/08/2015 °Elsevier Interactive Patient Education © 2019 [employer] ° °

## 2018-09-30 NOTE — Progress Notes (Signed)
Post Partum Day 1 Subjective: no complaints, up ad lib, voiding and tolerating PO  Would like to go home today  Objective: Blood pressure 131/89, pulse 71, temperature 98 F (36.7 C), temperature source Oral, resp. rate 18, height 5\' 4"  (1.626 m), weight 104 kg, last menstrual period 12/25/2017, SpO2 100 %, unknown if currently breastfeeding.  Physical Exam:  General: alert, cooperative and no distress Lochia: appropriate Uterine Fundus: firm Incision: healing well, no significant drainage, no dehiscence, no significant erythema DVT Evaluation: No evidence of DVT seen on physical exam. Negative Homan's sign. No cords or calf tenderness. No significant calf/ankle edema.  Recent Labs    09/28/18 1115  HGB 11.6*  HCT 36.1    Assessment/Plan: Breastfeeding, Lactation consult and Contraception IUD  Gestational HTN- stable, BP check 1 week D/c home today pending baby discharge   LOS: 2 days   Julianne Handler 09/30/2018, 11:04 AM

## 2018-09-30 NOTE — Lactation Note (Signed)
This note was copied from a baby's chart. Lactation Consultation Note  Patient Name: Kendra Aguilar Date: 09/30/2018   Initial visit at 26 hours of life. Mom is a P1. Once "Darlyn Chamber" was properly latched, swallows were immediately heard. Mom's nipple was appropriately rounded when infant released latch. She has no questions or concerns.  Mom was made aware of our breastfeeding support groups and our phone # for post-discharge questions. 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   Matthias Hughs [facility] 09/30/2018, 12:11 PM

## 2018-10-04 ENCOUNTER — Encounter: Payer: Self-pay | Admitting: Family Medicine

## 2018-10-05 ENCOUNTER — Telehealth: Payer: Self-pay | Admitting: Family Medicine

## 2018-10-05 ENCOUNTER — Encounter: Payer: Self-pay | Admitting: Family Medicine

## 2018-10-05 MED ORDER — SERTRALINE HCL 100 MG PO TABS: 100.0000 mg | ORAL_TABLET | Freq: Every day | ORAL | 0 refills | Status: DC

## 2018-10-05 NOTE — Telephone Encounter (Signed)
Patient requesting refill Zoloft 100 mg last filled 12/31/17.[facility]

## 2018-10-05 NOTE — Telephone Encounter (Signed)
Medication sent in and pt is aware. Pt is not feeling depressed or suicidal at this time. Pt transferred up front to set up virtual visit

## 2018-10-05 NOTE — Telephone Encounter (Signed)
May have 30 days that gives her time to set up a virtual follow-up  Please make sure that the patient is not feeling severely depressed or suicidal if so I would need to be alerted to that immediately

## 2018-10-06 ENCOUNTER — Other Ambulatory Visit: Payer: Self-pay

## 2018-10-06 ENCOUNTER — Ambulatory Visit: Payer: 59

## 2018-10-06 DIAGNOSIS — Z0130 Encounter for examination of blood pressure without abnormal findings: Secondary | ICD-10-CM

## 2018-10-06 NOTE — Progress Notes (Signed)
Pt here for blood pressure check 133/87 pulse 81. Spoke with Manus Gunning Dishmon about reading.continue take blood pressure at home. If blood reading 140 and above call. Appointment pp visit in three weeks. Pad CMA

## 2018-10-25 ENCOUNTER — Encounter: Payer: Self-pay | Admitting: *Deleted

## 2018-10-27 ENCOUNTER — Ambulatory Visit (INDEPENDENT_AMBULATORY_CARE_PROVIDER_SITE_OTHER): Payer: 59 | Admitting: Family Medicine

## 2018-10-27 ENCOUNTER — Other Ambulatory Visit: Payer: Self-pay

## 2018-10-27 DIAGNOSIS — F324 Major depressive disorder, single episode, in partial remission: Secondary | ICD-10-CM

## 2018-10-27 NOTE — Progress Notes (Signed)
   Subjective:    Patient ID: Kendra Aguilar, female    DOB: 1996-03-31, 23 y.o.   MRN: [id number]  HPI  Patient calls for a follow up on depression. Patient states she is doing well on Zoloft and has no problems or concerns. Patient states her depression is doing well she feels like she has more good moments than bad moments she is not suicidal she is taking her medicine on a regular basis at times she feels like the medication is making her feel numb to what is going on so sometimes she thinks about whether or not she needs to be on a lower dose she denies any other particular troubles currently denies panic attacks doing a good job parenting and also taking online classes this fall she will take online classes with [employer] Visit via Video Note  I connected with Kendra Aguilar on 10/27/18 at  3:00 PM EDT by a video enabled telemedicine application and verified that I am speaking with the correct person using two identifiers.  Location: Patient: home Provider: office   I discussed the limitations of evaluation and management by telemedicine and the availability of in person appointments. The patient expressed understanding and agreed to proceed.  History of Present Illness:    Observations/Objective:   Assessment and Plan:   Follow Up Instructions:    I discussed the assessment and treatment plan with the patient. The patient was provided an opportunity to ask questions and all were answered. The patient agreed with the plan and demonstrated an understanding of the instructions.   The patient was advised to call back or seek an in-person evaluation if the symptoms worsen or if the condition fails to improve as anticipated.  I provided 15 minutes of non-face-to-face time during this encounter.      Review of Systems  Constitutional: Negative for activity change and appetite change.  HENT: Negative for congestion and rhinorrhea.   Respiratory:  Negative for cough and shortness of breath.   Cardiovascular: Negative for chest pain and leg swelling.  Gastrointestinal: Negative for abdominal pain, nausea and vomiting.  Skin: Negative for color change.  Neurological: Negative for dizziness and weakness.  Psychiatric/Behavioral: Negative for agitation and confusion.       Objective:   Physical Exam   Patient had virtual visit Appears to be in no distress Atraumatic Neuro able to relate and oriented No apparent resp distress Color normal      Assessment & Plan:  Depression with anxiety actually doing better with medication continue the medication for the next 3 to 4 weeks then if patient is doing well and she would like to reduce it she can drop it half a tablet which would be 50 mg the patient will give [place] a MyChart update in 3 to 4 weeks The patient will also do a follow-up office visit in about 3 months either virtual or in person she was counseled that if things start getting worse to notify us immediately

## 2018-11-03 ENCOUNTER — Other Ambulatory Visit: Payer: Self-pay

## 2018-11-03 ENCOUNTER — Encounter: Payer: Self-pay | Admitting: Advanced Practice Midwife

## 2018-11-03 ENCOUNTER — Ambulatory Visit (INDEPENDENT_AMBULATORY_CARE_PROVIDER_SITE_OTHER): Payer: 59 | Admitting: Advanced Practice Midwife

## 2018-11-03 NOTE — Progress Notes (Signed)
TELEHEALTH VIRTUAL POSTPARTUM VISIT ENCOUNTER NOTE  I connected with@ on 11/03/18 at  1:45 PM EDT by telephone at home and verified that I am speaking with the correct person using two identifiers.   I discussed the limitations, risks, security and privacy concerns of performing an evaluation and management service by telephone and the availability of in person appointments. I also discussed with the patient that there may be a patient responsible charge related to this service. The patient expressed understanding and agreed to proceed.  Appointment Date: 11/03/2018  OBGYN Clinic: [facility]  Chief Complaint:  Postpartum Visit  History of Present Illness: ARMENIA SILVERIA is a 23 y.o.  G2P1011 (No LMP recorded.), seen for the above chief complaint. Her past medical history is significant for GHTN   127/70 Normal at home  She is s/p normal spontaneous vaginal delivery on  at 09/29/18  weeks; she was discharged to home on PPDD#2. Pregnancy complicated by GHTN. Baby is doing well.  Complains of nothing.  Feels well, stitches not giving her any trouble. BPs at home are normal  Vaginal bleeding or discharge: No  Mode of feeding infant: Bottle Intercourse: No  Contraception: abstinence PP depression s/s: No .  Any bowel or bladder issues: No  Pap smear: no abnormalities (date: 03/17/18)  Review of Systems: Positive for nothing. Her 12 point review of systems is negative or as noted in the History of Present Illness.  Patient Active Problem List   Diagnosis Date Noted  . Blood pressure check 10/06/2018  . NSVD (normal spontaneous vaginal delivery) 09/29/2018  . Second degree perineal laceration during delivery, delivered 09/29/2018  . Gestational hypertension 09/28/2018  . Rh negative state in antepartum period 05/25/2018  . Supervision of normal pregnancy 03/17/2018  . Depression with anxiety 02/22/2018  . Migraine 01/06/2018  . Suicidal thoughts 06/23/2017    Medications  Tatelyn L. Driver had no medications administered during this visit. Current Outpatient Medications  Medication Sig Dispense Refill  . sertraline (ZOLOFT) 100 MG tablet Take 1 tablet (100 mg total) by mouth daily. 30 tablet 0  . ibuprofen (ADVIL) 600 MG tablet Take 1 tablet (600 mg total) by mouth every 6 (six) hours. (Patient not taking: Reported on 11/03/2018) 30 tablet 0  . Prenatal Vit-Fe Fumarate-FA (MULTIVITAMIN-PRENATAL) 27-0.8 MG TABS tablet Take 1 tablet by mouth daily at 12 noon.     No current facility-administered medications for this visit.     Allergies Latex  Physical Exam:  General:  Alert, oriented and cooperative.   Mental Status: Normal mood and affect perceived. Normal judgment and thought content.  Rest of physical exam deferred due to type of encounter  PP Depression Screening:   Edinburgh Postnatal Depression Scale - 11/03/18 1413      Edinburgh Postnatal Depression Scale:  In the Past 7 Days   I have been able to laugh and see the funny side of things.  0    I have looked forward with enjoyment to things.  0    I have blamed myself unnecessarily when things went wrong.  2    I have been anxious or worried for no good reason.  2    I have felt scared or panicky for no good reason.  0    Things have been getting on top of me.  0    I have been so unhappy that I have had difficulty sleeping.  0    I have felt sad or miserable.  0    I have been so unhappy that I have been crying.  0    The thought of harming myself has occurred to me.  0    Edinburgh Postnatal Depression Scale Total  4       Assessment:Patient is a 23 y.o. G2P1011 who is 5 weeks from a normal spontaneous vaginal delivery.  She is doing very well.   Plan: Plan paragard in 1-2 weeks. No sex until then Continue monitoring zoloft /wPCP. Plans to decrease dose in a month or two d/t "feeling numb"  .  RTC 1-2 weeks for paragard  I discussed the assessment and treatment plan with the  patient. The patient was provided an opportunity to ask questions and all were answered. The patient agreed with the plan and demonstrated an understanding of the instructions.   The patient was advised to call back or seek an in-person evaluation/go to the ED for any concerning postpartum symptoms.  I provided 12 minutes of non-face-to-face time during this encounter.   Jacklyn ShellFrances Cresenzo-Dishmon, CNM [facility], [facility]

## 2018-11-03 NOTE — Patient Instructions (Signed)

## 2018-11-04 ENCOUNTER — Ambulatory Visit: Payer: 59 | Admitting: Advanced Practice Midwife

## 2018-11-15 ENCOUNTER — Other Ambulatory Visit: Payer: Self-pay

## 2018-11-15 ENCOUNTER — Encounter: Payer: Self-pay | Admitting: Women's Health

## 2018-11-15 ENCOUNTER — Ambulatory Visit (INDEPENDENT_AMBULATORY_CARE_PROVIDER_SITE_OTHER): Payer: 59 | Admitting: Women's Health

## 2018-11-15 VITALS — BP 132/84 | HR 73 | Ht 65.0 in | Wt 207.0 lb

## 2018-11-15 DIAGNOSIS — Z30430 Encounter for insertion of intrauterine contraceptive device: Secondary | ICD-10-CM | POA: Diagnosis not present

## 2018-11-15 DIAGNOSIS — Z3202 Encounter for pregnancy test, result negative: Secondary | ICD-10-CM

## 2018-11-15 LAB — POCT URINE PREGNANCY: Preg Test, Ur: NEGATIVE

## 2018-11-15 MED ORDER — PARAGARD INTRAUTERINE COPPER IU IUD
INTRAUTERINE_SYSTEM | Freq: Once | INTRAUTERINE | Status: AC
  Administered 2018-11-15: 1 via INTRAUTERINE

## 2018-11-15 NOTE — Progress Notes (Signed)
   IUD INSERTION Patient name: Kendra Aguilar MRN [id number]  Date of birth: 1996-01-26 Subjective Findings:   Kendra Aguilar is a 23 y.o. G81P1011 African American female being seen today for insertion of a Paragard IUD.   No LMP recorded. Last sexual intercourse was prior to birth of baby Last pap12/11/19. Results were:  normal  The risks and benefits of the method and placement have been thouroughly reviewed with the patient and all questions were answered.  Specifically the patient is aware of failure rate of 04/998, expulsion of the IUD and of possible perforation.  The patient is aware of irregular bleeding due to the method and understands the incidence of irregular bleeding diminishes with time.  Signed copy of informed consent in chart.  Pertinent History Reviewed:   Reviewed past medical,surgical, social, obstetrical and family history.  Reviewed problem list, medications and allergies. Objective Findings & Procedure:   Vitals:   11/15/18 0843  BP: 132/84  Pulse: 73  Weight: 207 lb (93.9 kg)  Height: 5\' 5"  (1.651 m)  Body mass index is 34.45 kg/m.  Results for orders placed or performed in visit on 11/15/18 (from the past 24 hour(s))  POCT urine pregnancy   Collection Time: 11/15/18  8:47 AM  Result Value Ref Range   Preg Test, Ur Negative Negative     Time out was performed.  A graves speculum was placed in the vagina.  The cervix was visualized, prepped using Betadine, and grasped with a single tooth tenaculum. The uterus was found to be retroflexed and it sounded to 8 cm.  Paragard  IUD placed per manufacturer's recommendations. The strings were trimmed to approximately 3 cm. The patient tolerated the procedure well.   Informal transvaginal sonogram was performed and the proper placement of the IUD was verified. Assessment & Plan:   1) Paragard IUD insertion The patient was given post procedure instructions, including signs and symptoms of infection and to  check for the strings after each menses or each month, and refraining from intercourse or anything in the vagina for 3 days. She was given a care card with date IUD placed, and date IUD to be removed. She is scheduled for a f/u appointment in 4 weeks.  Orders Placed This Encounter  Procedures  . POCT urine pregnancy    Return in about 4 weeks (around 12/13/2018) for F/U, MyChart Video.  [place], [facility] 11/15/2018 9:24 AM

## 2018-11-15 NOTE — Patient Instructions (Signed)
 Nothing in vagina for 3 days (no sex, douching, tampons, etc...)  Check your strings once a month to make sure you can feel them, if you are not able to please let us know  If you develop a fever of 100.4 or more in the next few weeks, or if you develop severe abdominal pain, please let us know  Use a backup method of birth control, such as condoms, for 2 weeks    Intrauterine Device Insertion, Care After  This sheet gives you information about how to care for yourself after your procedure. Your health care provider may also give you more specific instructions. If you have problems or questions, contact your health care provider. What can I expect after the procedure? After the procedure, it is common to have:  Cramps and pain in the abdomen.  Light bleeding (spotting) or heavier bleeding that is like your menstrual period. This may last for up to a few days.  Lower back pain.  Dizziness.  Headaches.  Nausea. Follow these instructions at home:  Before resuming sexual activity, check to make sure that you can feel the IUD string(s). You should be able to feel the end of the string(s) below the opening of your cervix. If your IUD string is in place, you may resume sexual activity. ? If you had a hormonal IUD inserted more than 7 days after your most recent period started, you will need to use a backup method of birth control for 7 days after IUD insertion. Ask your health care provider whether this applies to you.  Continue to check that the IUD is still in place by feeling for the string(s) after every menstrual period, or once a month.  Take over-the-counter and prescription medicines only as told by your health care provider.  Do not drive or use heavy machinery while taking prescription pain medicine.  Keep all follow-up visits as told by your health care provider. This is important. Contact a health care provider if:  You have bleeding that is heavier or lasts longer than  a normal menstrual cycle.  You have a fever.  You have cramps or abdominal pain that get worse or do not get better with medicine.  You develop abdominal pain that is new or is not in the same area of earlier cramping and pain.  You feel lightheaded or weak.  You have abnormal or bad-smelling discharge from your vagina.  You have pain during sexual activity.  You have any of the following problems with your IUD string(s): ? The string bothers or hurts you or your sexual partner. ? You cannot feel the string. ? The string has gotten longer.  You can feel the IUD in your vagina.  You think you may be pregnant, or you miss your menstrual period.  You think you may have an STI (sexually transmitted infection). Get help right away if:  You have flu-like symptoms.  You have a fever and chills.  You can feel that your IUD has slipped out of place. Summary  After the procedure, it is common to have cramps and pain in the abdomen. It is also common to have light bleeding (spotting) or heavier bleeding that is like your menstrual period.  Continue to check that the IUD is still in place by feeling for the string(s) after every menstrual period, or once a month.  Keep all follow-up visits as told by your health care provider. This is important.  Contact your health care provider if   you have problems with your IUD string(s), such as the string getting longer or bothering you or your sexual partner. This information is not intended to replace advice given to you by your health care provider. Make sure you discuss any questions you have with your health care provider. Document Released: 11/20/2010 Document Revised: 03/06/2017 Document Reviewed: 02/13/2016 Elsevier Patient Education  2020 Elsevier Inc.  

## 2018-12-14 ENCOUNTER — Encounter: Payer: Self-pay | Admitting: Women's Health

## 2018-12-14 ENCOUNTER — Other Ambulatory Visit: Payer: Self-pay

## 2018-12-14 ENCOUNTER — Ambulatory Visit (INDEPENDENT_AMBULATORY_CARE_PROVIDER_SITE_OTHER): Payer: 59 | Admitting: Women's Health

## 2018-12-14 VITALS — BP 119/81 | HR 90 | Ht 65.0 in | Wt 211.5 lb

## 2018-12-14 DIAGNOSIS — Z30431 Encounter for routine checking of intrauterine contraceptive device: Secondary | ICD-10-CM

## 2018-12-14 NOTE — Progress Notes (Signed)
   GYN VISIT Patient name: Kendra Aguilar MRN [id number]  Date of birth: [birth year] Chief Complaint:   IUD check  History of Present Illness:   Kendra Aguilar is a 23 y.o. G5P1011 African American female being seen today for IUD check. Paragard IUD inserted 11/15/18.  No problems, able to feel strings, hasn't had sex yet. Period was a little heavier than what she was expecting, but still wasn't bad.   No LMP recorded. (Menstrual status: IUD). The current method of family planning is IUD.  Last pap 03/17/18. Results were:  normal Review of Systems:   Pertinent items are noted in HPI Denies fever/chills, dizziness, headaches, visual disturbances, fatigue, shortness of breath, chest pain, abdominal pain, vomiting, abnormal vaginal discharge/itching/odor/irritation, problems with periods, bowel movements, urination, or intercourse unless otherwise stated above.  Pertinent History Reviewed:  Reviewed past medical,surgical, social, obstetrical and family history.  Reviewed problem list, medications and allergies. Physical Assessment:   Vitals:   12/14/18 0935  BP: 119/81  Pulse: 90  Weight: 211 lb 8 oz (95.9 kg)  Height: 5\' 5"  (1.651 m)  Body mass index is 35.2 kg/m.       Physical Examination:   General appearance: alert, well appearing, and in no distress  Mental status: alert, oriented to person, place, and time  Skin: warm & dry   Cardiovascular: normal heart rate noted  Respiratory: normal respiratory effort, no distress  Abdomen: soft, non-tender   Pelvic: VULVA: normal appearing vulva with no masses, tenderness or lesions, VAGINA: normal appearing vagina with normal color and discharge, no lesions, CERVIX: normal appearing cervix without discharge or lesions, IUD strings visible, appropriate length  Extremities: no edema   No results found for this or any previous visit (from the past 24 hour(s)).  Assessment & Plan:  1) IUD check> Paragard in place, check strings  monthly  Meds: No orders of the defined types were placed in this encounter.   No orders of the defined types were placed in this encounter.   Return in about 1 year (around 12/14/2019) for Physical.  [place], [facility] 12/14/2018 9:46 AM

## 2018-12-17 DIAGNOSIS — H52203 Unspecified astigmatism, bilateral: Secondary | ICD-10-CM | POA: Diagnosis not present

## 2019-04-21 ENCOUNTER — Other Ambulatory Visit: Payer: Self-pay

## 2019-04-21 ENCOUNTER — Ambulatory Visit: Payer: 59 | Attending: Internal Medicine

## 2019-04-21 DIAGNOSIS — Z20822 Contact with and (suspected) exposure to covid-19: Secondary | ICD-10-CM | POA: Diagnosis not present

## 2019-04-23 LAB — NOVEL CORONAVIRUS, NAA: SARS-CoV-2, NAA: NOT DETECTED

## 2019-05-21 ENCOUNTER — Other Ambulatory Visit: Payer: Self-pay | Admitting: Family Medicine

## 2019-05-23 NOTE — Telephone Encounter (Signed)
30-day with 1 refill needs follow-up visit virtual or in person

## 2019-05-23 NOTE — Telephone Encounter (Signed)
Please schedule and then route back to nurses 

## 2019-05-24 MED ORDER — SERTRALINE HCL 100 MG PO TABS: 100.0000 mg | ORAL_TABLET | Freq: Every day | ORAL | 0 refills | Status: DC

## 2019-05-24 MED FILL — SERTRALINE HCL 100 MG TAB: 100 | 30 days supply | Qty: 30 | Fill #0

## 2019-05-24 NOTE — Telephone Encounter (Signed)
Scheduled 3/10

## 2019-06-15 ENCOUNTER — Ambulatory Visit (INDEPENDENT_AMBULATORY_CARE_PROVIDER_SITE_OTHER): Payer: 59 | Admitting: Family Medicine

## 2019-06-15 ENCOUNTER — Other Ambulatory Visit: Payer: Self-pay

## 2019-06-15 DIAGNOSIS — F325 Major depressive disorder, single episode, in full remission: Secondary | ICD-10-CM

## 2019-06-15 MED ORDER — SERTRALINE HCL 100 MG PO TABS: 100.0000 mg | ORAL_TABLET | Freq: Every day | ORAL | 1 refills | Status: DC

## 2019-06-15 NOTE — Progress Notes (Signed)
Subjective:    Patient ID: Kendra Aguilar, female    DOB: 1996/01/29, 24 y.o.   MRN: [id number]  HPI  Patient calls for a follow up on depression. Patient states she is doing well on current med with no problems or concerns. See PHQ9 Depression screen [facility] 2/9 06/15/2019 03/17/2018 01/06/2018 06/22/2017 06/22/2017  Decreased Interest 0 2 0 2 2  Down, Depressed, Hopeless 2 1 0 2 2  PHQ - 2 Score 2 3 0 4 4  Altered sleeping 0 3 - 3 3  Tired, decreased energy 0 3 - 2 2  Change in appetite 0 3 - 1 1  Feeling bad or failure about yourself  0 2 - 3 3  Trouble concentrating 2 1 - 2 2  Moving slowly or fidgety/restless 2 1 - 3 3  Suicidal thoughts 0 0 - 2 2  PHQ-9 Score 6 16 - 20 20  Difficult doing work/chores Somewhat difficult Somewhat difficult - - -   Patient does have underlying depression she does a good job with her medicine states her stress levels are good she started a full-time job at [employer] denies any major setbacks states her symptoms are much improved she does want to continue the medicine Virtual Visit via Video Note  I connected with Kendra Aguilar on 06/15/19 at  8:30 AM EST by a video enabled telemedicine application and verified that I am speaking with the correct person using two identifiers.  Location: Patient: home Provider: office   I discussed the limitations of evaluation and management by telemedicine and the availability of in person appointments. The patient expressed understanding and agreed to proceed.  History of Present Illness:    Observations/Objective:   Assessment and Plan:   Follow Up Instructions:    I discussed the assessment and treatment plan with the patient. The patient was provided an opportunity to ask questions and all were answered. The patient agreed with the plan and demonstrated an understanding of the instructions.   The patient was advised to call back or seek an in-person evaluation if the symptoms worsen or if the  condition fails to improve as anticipated.  I provided 16 minutes of non-face-to-face time during this encounter.   The patient denies any suicidal ideation.  States her moods overall been doing good.  States she is tolerating the medicine well.  Only has an occasional down day.  For the most part feels much better about how things are going.  Not socially isolated.  Working full-time job at [employer] in [employer].   Review of Systems  Constitutional: Negative for activity change, appetite change and fatigue.  HENT: Negative for congestion.   Respiratory: Negative for cough.   Cardiovascular: Negative for chest pain.  Gastrointestinal: Negative for abdominal pain.  Skin: Negative for color change.  Neurological: Negative for headaches.  Psychiatric/Behavioral: Negative for behavioral problems.       Objective:   Physical Exam   Today's visit was via telephone Physical exam was not possible for this visit      Assessment & Plan:  Depression in remission Patient states medication is helping her She would like to continue the medicine We will send in 90-day supply with 1 refill until follow-up visit in 6 months She will connect with [place] either by phone or MyChart message should anything change or any problems arise. She is not planning on getting pregnant anytime soon.  She was told that should she get pregnant  we would need to take her off of the medicine or should she plan on having a child we would like to taper off of the medicine.  She understands this.

## 2019-06-17 MED FILL — SERTRALINE HCL 100 MG TAB: 100 | 90 days supply | Qty: 90 | Fill #0

## 2020-07-31 DIAGNOSIS — H5213 Myopia, bilateral: Secondary | ICD-10-CM | POA: Diagnosis not present

## 2020-09-14 ENCOUNTER — Other Ambulatory Visit (HOSPITAL_COMMUNITY): Payer: Self-pay

## 2020-09-14 ENCOUNTER — Ambulatory Visit (INDEPENDENT_AMBULATORY_CARE_PROVIDER_SITE_OTHER): Payer: 59 | Admitting: Nurse Practitioner

## 2020-09-14 ENCOUNTER — Other Ambulatory Visit: Payer: Self-pay

## 2020-09-14 VITALS — BP 117/76 | HR 97 | Temp 98.6°F | Ht 65.0 in | Wt 218.0 lb

## 2020-09-14 DIAGNOSIS — Z01419 Encounter for gynecological examination (general) (routine) without abnormal findings: Secondary | ICD-10-CM | POA: Diagnosis not present

## 2020-09-14 DIAGNOSIS — Z113 Encounter for screening for infections with a predominantly sexual mode of transmission: Secondary | ICD-10-CM | POA: Diagnosis not present

## 2020-09-14 DIAGNOSIS — Z1151 Encounter for screening for human papillomavirus (HPV): Secondary | ICD-10-CM

## 2020-09-14 DIAGNOSIS — Z124 Encounter for screening for malignant neoplasm of cervix: Secondary | ICD-10-CM | POA: Diagnosis not present

## 2020-09-14 MED ORDER — PHENTERMINE HCL 37.5 MG PO TABS
37.5000 mg | ORAL_TABLET | Freq: Every day | ORAL | 2 refills | Status: DC
  Filled 2020-09-14: qty 30, 30d supply, fill #0
  Filled 2020-10-25: qty 30, 30d supply, fill #1
  Filled 2020-12-06: qty 30, 30d supply, fill #2

## 2020-09-14 NOTE — Progress Notes (Signed)
Subjective:    Patient ID: Kendra Aguilar, female    DOB: [birth year], 25 y.o.   MRN: [id number]  HPI The patient comes in today for a wellness visit.    A review of their health history was completed.  A review of medications was also completed.  Any needed refills; none  Eating habits: health conscious  Falls/  MVA accidents in past few months: none  Regular exercise: 1 work out 3 x week  Specialist pt sees on regular basis: none  Preventative health issues were discussed.   Additional concerns: weight / BMI; would like to start medicine. Has taken phentermine in the past without difficulty in 2018. Works as a Associate Professor. No new sexual partners. Has Paraguard IUD. Regular cycles, heavy first 3 days then moderate flow for 2 days.  Regular vision and dental exams.    Review of Systems  Constitutional:  Negative for activity change and appetite change.  Respiratory:  Negative for cough, chest tightness, shortness of breath and wheezing.   Cardiovascular:  Negative for chest pain.  Gastrointestinal:  Negative for abdominal distention, abdominal pain, constipation, diarrhea, nausea and vomiting.  Genitourinary:  Negative for difficulty urinating, dyspareunia, dysuria, enuresis, frequency, genital sores, menstrual problem, pelvic pain, urgency and vaginal discharge.       Finishing her cycle. Light spotting today.   Depression screen [facility] 2/9 09/14/2020 06/15/2019 03/17/2018 01/06/2018 06/22/2017  Decreased Interest 0 0 2 0 2  Down, Depressed, Hopeless 0 2 1 0 2  PHQ - 2 Score 0 2 3 0 4  Altered sleeping - 0 3 - 3  Tired, decreased energy - 0 3 - 2  Change in appetite - 0 3 - 1  Feeling bad or failure about yourself  - 0 2 - 3  Trouble concentrating - 2 1 - 2  Moving slowly or fidgety/restless - 2 1 - 3  Suicidal thoughts - 0 0 - 2  PHQ-9 Score - 6 16 - 20  Difficult doing work/chores - Somewhat difficult Somewhat difficult - -       Objective:   Physical  Exam Constitutional:      General: She is not in acute distress.    Appearance: She is well-developed.  Neck:     Thyroid: No thyromegaly.     Trachea: No tracheal deviation.     Comments: Thyroid non tender to palpation. No mass or goiter noted.  Cardiovascular:     Rate and Rhythm: Normal rate and regular rhythm.     Heart sounds: Normal heart sounds. No murmur heard. Pulmonary:     Effort: Pulmonary effort is normal.     Breath sounds: Normal breath sounds.  Chest:  Breasts:    Right: No swelling, inverted nipple, mass, skin change, tenderness, axillary adenopathy or supraclavicular adenopathy.     Left: No swelling, inverted nipple, mass, skin change, tenderness, axillary adenopathy or supraclavicular adenopathy.  Abdominal:     General: There is no distension.     Palpations: Abdomen is soft.     Tenderness: There is no abdominal tenderness.  Genitourinary:    Vagina: Normal.     Comments: External GU: no rashes or lesions. Vagina: pink, no discharge. Cervix normal in appearance. IUD string noted. Small amount of dark blood at the os. No CMT. Bimanual exam: no tenderness or obvious masses, exam limited due to abdominal girth.  Musculoskeletal:     Cervical back: Normal range of motion and neck supple.  Lymphadenopathy:  Cervical: No cervical adenopathy.     Upper Body:     Right upper body: No supraclavicular, axillary or pectoral adenopathy.     Left upper body: No supraclavicular, axillary or pectoral adenopathy.  Skin:    General: Skin is warm and dry.     Findings: No rash.  Neurological:     Mental Status: She is alert and oriented to person, place, and time.  Psychiatric:        Mood and Affect: Mood normal.        Behavior: Behavior normal.        Thought Content: Thought content normal.        Judgment: Judgment normal.   Today's Vitals   09/14/20 1428  BP: 117/76  Pulse: 97  Temp: 98.6 F (37 C)  SpO2: 96%  Weight: 218 lb (98.9 kg)  Height: 5\' 5"   (1.651 m)   Body mass index is 36.28 kg/m.         Assessment & Plan:   Problem List Items Addressed This Visit       Other   Morbid obesity (HCC)   Relevant Medications   phentermine (ADIPEX-P) 37.5 MG tablet   Other Visit Diagnoses     Well woman exam    -  Primary   Relevant Orders   IGP,CtNg,AptimaHPV,rfx16/18,45   Screening for cervical cancer       Relevant Orders   IGP,CtNg,AptimaHPV,rfx16/18,45   Screening for HPV (human papillomavirus)       Relevant Orders   IGP,CtNg,AptimaHPV,rfx16/18,45   Screen for STD (sexually transmitted disease)       Relevant Orders   IGP,CtNg,AptimaHPV,rfx16/18,45      Meds ordered this encounter  Medications   phentermine (ADIPEX-P) 37.5 MG tablet    Sig: Take 1 tablet (37.5 mg total) by mouth daily before breakfast.    Dispense:  30 tablet    Refill:  2    Order Specific Question:   Supervising Provider    Answer:   A [[id number]]   Discussed medication options for weight loss.  Restart Phentermine as directed. DC medication and call if any problems.  Consider weight loss program. Continue regular activity. Return in about 1 year (around 09/14/2021) for physical. Recheck in 3 month if she wishes to continue weight loss medicine.

## 2020-09-15 ENCOUNTER — Encounter: Payer: Self-pay | Admitting: Nurse Practitioner

## 2020-09-20 LAB — IGP,CTNG,APTIMAHPV,RFX16/18,45
Chlamydia, Nuc. Acid Amp: NEGATIVE
Gonococcus by Nucleic Acid Amp: NEGATIVE
HPV Aptima: NEGATIVE

## 2020-10-25 ENCOUNTER — Other Ambulatory Visit (HOSPITAL_COMMUNITY): Payer: Self-pay

## 2020-12-06 ENCOUNTER — Other Ambulatory Visit (HOSPITAL_COMMUNITY): Payer: Self-pay

## 2021-05-08 ENCOUNTER — Encounter: Payer: Self-pay | Admitting: Nurse Practitioner

## 2021-05-13 NOTE — Telephone Encounter (Signed)
Nurses This message was forwarded to me to handle because Kendra Aguilar was out  You may forward a copy of this message to [place]  GLP 1 medications can be helpful at trying to lose weight.  The medication we recommend is Reginal Lutes This medication is started off at the lowest dose once weekly and every 4 weeks can increase the dose  Common side effects include nausea, GI upset, approximately 30% of individuals have nausea with the medicine approximately 20 to 25% can have intermittent vomiting A small percentage can have serious problems such as pancreatitis Individuals who have history of thyroid cancer or a strong family history of medullary thyroid cancer should not take these medicines If progressive symptoms or worse such as severe abdominal pain we recommend stopping the medication  Before starting the medicines we recommend a office consultation regarding the medications and discussion on healthy diet and regular activity-which it sounds like you are already doing  So certainly these medications are a potential benefit.  I would recommend an office visit with either Kendra Aguilar or myself to get the ball rolling.  When you schedule that visit-the discussion will be focused in on this issue alone

## 2021-05-17 ENCOUNTER — Other Ambulatory Visit: Payer: Self-pay

## 2021-05-17 ENCOUNTER — Ambulatory Visit: Payer: 59 | Admitting: Nurse Practitioner

## 2021-05-17 ENCOUNTER — Telehealth: Payer: 59 | Admitting: Nurse Practitioner

## 2021-05-17 ENCOUNTER — Other Ambulatory Visit (HOSPITAL_COMMUNITY): Payer: Self-pay

## 2021-05-17 ENCOUNTER — Encounter: Payer: Self-pay | Admitting: Nurse Practitioner

## 2021-05-17 VITALS — BP 130/74 | Temp 98.0°F | Wt 216.8 lb

## 2021-05-17 DIAGNOSIS — R5383 Other fatigue: Secondary | ICD-10-CM | POA: Diagnosis not present

## 2021-05-17 DIAGNOSIS — R635 Abnormal weight gain: Secondary | ICD-10-CM

## 2021-05-17 MED ORDER — WEGOVY 0.25 MG/0.5ML ~~LOC~~ SOAJ
0.2500 mg | SUBCUTANEOUS | 0 refills | Status: DC
  Filled 2021-05-17 – 2021-07-24 (×3): qty 2, 28d supply, fill #0

## 2021-05-17 NOTE — Progress Notes (Signed)
° °  Subjective:    Patient ID: [name], female    DOB: 1995/08/17, 26 y.o.   MRN: [id number]  HPI Pt here to talk about getting on Wegovy. Pt is wanting to lose weight and has hit a plateau. Pt has tried Phentermine twice but didn't change weight. Pt has tried diet and exercise. Works out 4 x per week. Eats 3 meals plus snacks. Has cut back on simple carbs. Substituting cauliflower and zucchini for rice and noodles. Using [employer] service. Eats mainly grilled chicken. Still struggling to lose weight.  Denies any difficulty swallowing or sore throats. No reflux or abdominal pain. No N/V. Some fatigue but relates this to work hours.  Denies any personal history of pancreatitis. No family history of thyroid, pancreatic or other endocrine cancers.        Objective:   Physical Exam NAD. Alert, oriented. Thyroid non tender to palpation, no mass or goiter noted. Lungs clear. Heart RRR. Abdomen soft, non distended, non tender. No obvious masses. Today's Vitals   05/17/21 1039  BP: 130/74  Temp: 98 F (36.7 C)  Weight: 216 lb 12.8 oz (98.3 kg)   Body mass index is 36.08 kg/m.        Assessment & Plan:   Problem List Items Addressed This Visit       Other   Morbid obesity (HCC)   Relevant Medications   Semaglutide-Weight Management (WEGOVY) 0.25 MG/0.5ML SOAJ   Other Visit Diagnoses     Other fatigue    -  Primary   Relevant Orders   TSH   Weight gain          Meds ordered this encounter  Medications   Semaglutide-Weight Management (WEGOVY) 0.25 MG/0.5ML SOAJ    Sig: Inject 0.25 mg into the skin once a week.    Dispense:  2 mL    Refill:  0    Order Specific Question:   Supervising Provider    Answer:   Lilyan Punt A [[id number]]   TSH pending.  Discussed potential adverse effects of Wegovy. DC med and contact office if any severe problems.  Will start with lowest dose. Contact office to let us know if she is tolerating this dose after one month and  we will increase to next dose.  Continue healthy lifestyle habits.  Return in about 3 months (around 08/14/2021).

## 2021-05-18 LAB — TSH: TSH: 1.66 u[IU]/mL (ref 0.450–4.500)

## 2021-05-22 ENCOUNTER — Telehealth: Payer: Self-pay | Admitting: Family Medicine

## 2021-05-22 NOTE — Telephone Encounter (Signed)
PA for Kendra Aguilar has been approved from 05/22/21-08/18/21. My chart message sent to patient.

## 2021-05-24 ENCOUNTER — Other Ambulatory Visit (HOSPITAL_COMMUNITY): Payer: Self-pay

## 2021-06-03 ENCOUNTER — Other Ambulatory Visit (HOSPITAL_COMMUNITY): Payer: Self-pay

## 2021-06-13 ENCOUNTER — Other Ambulatory Visit: Payer: Self-pay

## 2021-06-13 ENCOUNTER — Ambulatory Visit
Admission: EM | Admit: 2021-06-13 | Discharge: 2021-06-13 | Disposition: A | Discharge status: Home / Self Care | Payer: 59 | Attending: Urgent Care | Admitting: Urgent Care

## 2021-06-13 DIAGNOSIS — R070 Pain in throat: Secondary | ICD-10-CM

## 2021-06-13 DIAGNOSIS — J029 Acute pharyngitis, unspecified: Secondary | ICD-10-CM

## 2021-06-13 DIAGNOSIS — R1310 Dysphagia, unspecified: Secondary | ICD-10-CM

## 2021-06-13 LAB — POCT RAPID STREP A (OFFICE): Rapid Strep A Screen: POSITIVE — AB

## 2021-06-13 MED ORDER — AMOXICILLIN 500 MG PO CAPS: 500.0000 mg | ORAL_CAPSULE | Freq: Two times a day (BID) | ORAL | 0 refills | Status: DC

## 2021-06-13 NOTE — ED Triage Notes (Signed)
Pt presents with sore throat with swollen tonsils for past couple of days  ?

## 2021-06-13 NOTE — ED Provider Notes (Signed)
?[place] ? ? ?MRN: [id number] DOB: [birth year] ? ?Subjective:  ? ?Kendra Aguilar is a 26 y.o. female presenting for 2-day history of acute onset persistent and worsening throat pain, painful swallowing, enlarged tonsils with pus lines.  No cough, chest pain, shortness of breath.  She does have a runny stuffy nose.  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 ? ?No current facility-administered medications for this encounter. ? ?Current Outpatient Medications:  ?  PARAGARD INTRAUTERINE COPPER IU, by Intrauterine route., Disp: , Rfl:  ?  Semaglutide-Weight Management (WEGOVY) 0.25 MG/0.5ML SOAJ, Inject 0.25 mg into the skin once a week., Disp: 2 mL, Rfl: 0  ? ?Allergies  ?Allergen Reactions  ? Latex   ?  rash  ? ? ?Past Medical History:  ?Diagnosis Date  ? Anxiety   ? CHICKENPOX, HX OF 08/08/2008  ? Qualifier: Diagnosis of  By: Jonna Munro MD, Roderic Scarce    ? Depression   ?  ? ?Past Surgical History:  ?Procedure Laterality Date  ? WISDOM TOOTH EXTRACTION    ? ? ?Family History  ?Problem Relation Age of Onset  ? Hyperlipidemia Mother   ? Diabetes Maternal Grandmother   ? Heart attack Maternal Grandmother   ? Diabetes Brother   ?     Type I  ? Other Brother   ?     knee and eye problems  ? ? ?Social History  ? ?Tobacco Use  ? Smoking status: Never  ? Smokeless tobacco: Never  ?Vaping Use  ? Vaping Use: Never used  ?Substance Use Topics  ? Alcohol use: No  ? Drug use: No  ? ? ?ROS ? ? ?Objective:  ? ?Vitals: ?BP 119/82   Pulse (!) 101   Temp 98.9 ?F (37.2 ?C) (Oral)   Resp 18   SpO2 97%  ? ?Physical Exam ?Constitutional:   ?   General: She is not in acute distress. ?   Appearance: Normal appearance. She is well-developed. She is not ill-appearing, toxic-appearing or diaphoretic.  ?HENT:  ?   Head: Normocephalic and atraumatic.  ?   Nose: Nose normal.  ?   Mouth/Throat:  ?   Mouth: Mucous membranes are moist.  ?   Pharynx: Pharyngeal swelling, oropharyngeal exudate and  posterior oropharyngeal erythema present. No uvula swelling.  ?   Tonsils: Tonsillar exudate present. No tonsillar abscesses. 1+ on the right. 1+ on the left.  ?Eyes:  ?   General: No scleral icterus.    ?   Right eye: No discharge.     ?   Left eye: No discharge.  ?   Extraocular Movements: Extraocular movements intact.  ?Cardiovascular:  ?   Rate and Rhythm: Normal rate.  ?Pulmonary:  ?   Effort: Pulmonary effort is normal.  ?Skin: ?   General: Skin is warm and dry.  ?Neurological:  ?   General: No focal deficit present.  ?   Mental Status: She is alert and oriented to person, place, and time.  ?Psychiatric:     ?   Mood and Affect: Mood normal.     ?   Behavior: Behavior normal.  ? ? ?Results for orders placed or performed during the hospital encounter of 06/13/21 (from the past 24 hour(s))  ?POCT rapid strep A     Status: Abnormal  ? Collection Time: 06/13/21  9:49 AM  ?Result Value Ref Range  ? Rapid Strep A Screen Positive (A) Negative  ? ? ?Assessment and Plan :  ? ?  PDMP not reviewed this encounter. ? ?1. Acute pharyngitis, unspecified etiology   ?2. Throat pain   ?3. Painful swallowing   ? ? Will treat for strep pharyngitis.  Patient is to start amoxicillin, use supportive care otherwise. Counseled patient on potential for adverse effects with medications prescribed/recommended today, ER and return-to-clinic precautions discussed, patient verbalized understanding. ? ?  ?Jaynee Eagles, PA-C ?06/13/21 [id number] ? ?

## 2021-07-24 ENCOUNTER — Other Ambulatory Visit (HOSPITAL_COMMUNITY): Payer: Self-pay

## 2021-12-03 ENCOUNTER — Encounter: Payer: Self-pay | Admitting: Family Medicine

## 2021-12-03 ENCOUNTER — Ambulatory Visit (INDEPENDENT_AMBULATORY_CARE_PROVIDER_SITE_OTHER): Payer: 59 | Admitting: Family Medicine

## 2021-12-03 VITALS — BP 117/72 | Ht 64.75 in | Wt 220.2 lb

## 2021-12-03 DIAGNOSIS — Z0000 Encounter for general adult medical examination without abnormal findings: Secondary | ICD-10-CM | POA: Diagnosis not present

## 2021-12-03 MED ORDER — WEGOVY 0.25 MG/0.5ML ~~LOC~~ SOAJ: SUBCUTANEOUS | 0 refills | Status: AC

## 2021-12-03 NOTE — Patient Instructions (Signed)
Discussion of GLP-1 medications Please remember these medications are to assist with weight reduction and diabetes control.  They do not take the place of healthy eating and regular physical activity. There are several GLP-1 medications that can help with weight reduction and diabetes control.  GLP-1 medications with indication for diabetes-Trulicity, Victoza, Bydureon , Mounjaro, Ozempic, and Rybelsus (although these are injected medicines except for Rybelsus which is a pill) GLP-1 medications with indications for weight loss-Wegovy, and Saxenda  Mechanism of action  These medications stimulate glucagon peptide receptors.  By doing so it does the following:  1-slows down stomach emptying-essentially food takes longer to go through the stomach and the intestines-this can lessen appetite  2-reduces glucagon secretion from the pancreas-this helps keep blood glucose levels stable between meals  3-increase his insulin release from the pancreas-with diabetics this helps keep blood glucose levels stable  4-promotes the feeling of being full in the brain-encouraged him receptors in the brain received the signal so the brain and body know that it is time to stop eating Benefits of the medication  1-reduced weight-reduction of weight is more significant at higher dosing.  Not as much weight reduction with Rybelsus   A- typically Wegovy at higher doses can assist with significant weight reduction.  2-improved blood glucose levels-with diabetics typically we see a significant drop with A1c when the medication is adjusted appropriately.  3-decrease risk of heart attacks and strokes-individuals with type 2 diabetes are at increased risk of heart attacks and strokes.  These GLP-1 medications can reduce the risk by 22%.  Risk of GLP-1 medications-these are some of the risks.  It is important to talk with your provider in a shared discussion before starting any medication.  Contraindications to GLP-1  medications-in other words who should not take these.  1-individuals with history of thyroid medullary cancer  2-individuals with family history of multiple endocrine neoplasm syndrome type II (MEN 2)  3-individuals with a history of pancreatitis  4-those with a history of severe hypersensitivity or allergy reactions to GLP-1 medications  5-should be avoided in individuals with a history of suicidal attempts or active suicidal ideation.  Cautions include- 1- risk of thyroid C-cell tumors were seen in rats during clinical testing in humans the relevancy of this information has not been determined 2-risk of pancreatitis including fatal and nonfatal hemorrhagic or necrotizing pancreatitis 3-gallbladder disease slightly increased risk of gallstones 4-hypoglycemia-more common with individuals who are on insulin or sulfonylurea such as glipizide 5-acute kidney injury or worsening of chronic renal failure often this is triggered by severe vomiting and diarrhea as side effects to GLP-1. 6-slightly increased risk of diabetic retinopathy complications in patients with type 2 diabetes 7-heart rate increase-slight increase of base heart rate with GLP-1 8-suicidal behavior and ideation has been reported in clinical trials with other weight loss medicines.  If depression occurs with medication or suicidal ideation stop medication immediately notify provider or seek help.  Common side effects include nausea, vomiting, diarrhea, constipation and increased heart rate.  Less common side effects severe abdominal pain-if this occurs notify provider stop medication get tested for pancreatitis. Full discussion with your provider should occur before starting these medicines.       

## 2021-12-03 NOTE — Progress Notes (Signed)
   Subjective:    Patient ID: Kendra Aguilar, female    DOB: July 04, 1995, 26 y.o.   MRN: [id number]  HPI The patient comes in today for a wellness visit.  From a mental health standpoint she states she is doing good   A review of their health history was completed.  A review of medications was also completed.  Any needed refills; [id number]  Eating habits: Fair  Falls/  MVA accidents in past few months: none  Regular exercise: walks at least 2 miles a day  Specialist pt sees on regular basis: none  Preventative health issues were discussed.   Additional concerns: none    Review of Systems     Objective:   Physical Exam  General-in no acute distress Eyes-no discharge Lungs-respiratory rate normal, CTA CV-no murmurs,RRR Extremities skin warm dry no edema Neuro grossly normal Behavior normal, alert       Assessment & Plan:  Wellness Up-to-date on Pap smear Mammogram not indicated Colonoscopy not indicated Safety discussed Patient follows dietary regimen we did discuss healthy diet breakfast lunch dinner keeping calories at 1700 or less We also discussed regular physical activity walking 2 miles a day and incorporating strength training 30 minutes twice weekly Also encouraged her to use my fitness pal or similar online app to help track her foods  We did discuss side effects of Wegovy she denies having any problems previous time that she had it she states she is interested in starting this again she states that she will is desiring to lose weight and maintain healthy habits  Morbid obesity-she has tried multiple ways of trying to lose weight including diet exercise logging her diet none of this has been successful so far her weight is causing significant joint discomfort in the knees
